# Patient Record
Sex: Female | Born: 1960 | Race: White | Hispanic: No | Smoking: Current every day smoker
Health system: Southern US, Community
[De-identification: ages and names within clinical notes are randomized; demographics above are authoritative.]

## PROBLEM LIST (undated history)

## (undated) DIAGNOSIS — M1990 Unspecified osteoarthritis, unspecified site: Secondary | ICD-10-CM

## (undated) DIAGNOSIS — I10 Essential (primary) hypertension: Secondary | ICD-10-CM

## (undated) DIAGNOSIS — R413 Other amnesia: Secondary | ICD-10-CM

## (undated) DIAGNOSIS — K219 Gastro-esophageal reflux disease without esophagitis: Secondary | ICD-10-CM

## (undated) DIAGNOSIS — M549 Dorsalgia, unspecified: Secondary | ICD-10-CM

## (undated) DIAGNOSIS — R519 Headache, unspecified: Secondary | ICD-10-CM

## (undated) DIAGNOSIS — K5900 Constipation, unspecified: Secondary | ICD-10-CM

## (undated) DIAGNOSIS — Z8489 Family history of other specified conditions: Secondary | ICD-10-CM

## (undated) DIAGNOSIS — M26629 Arthralgia of temporomandibular joint, unspecified side: Secondary | ICD-10-CM

## (undated) DIAGNOSIS — R202 Paresthesia of skin: Secondary | ICD-10-CM

## (undated) DIAGNOSIS — M2550 Pain in unspecified joint: Secondary | ICD-10-CM

## (undated) DIAGNOSIS — R42 Dizziness and giddiness: Secondary | ICD-10-CM

## (undated) DIAGNOSIS — M2540 Effusion, unspecified joint: Secondary | ICD-10-CM

## (undated) DIAGNOSIS — M62838 Other muscle spasm: Secondary | ICD-10-CM

## (undated) DIAGNOSIS — Z9889 Other specified postprocedural states: Secondary | ICD-10-CM

## (undated) DIAGNOSIS — R200 Anesthesia of skin: Secondary | ICD-10-CM

## (undated) DIAGNOSIS — E785 Hyperlipidemia, unspecified: Secondary | ICD-10-CM

## (undated) DIAGNOSIS — Z8739 Personal history of other diseases of the musculoskeletal system and connective tissue: Secondary | ICD-10-CM

## (undated) DIAGNOSIS — R112 Nausea with vomiting, unspecified: Secondary | ICD-10-CM

## (undated) DIAGNOSIS — R51 Headache: Secondary | ICD-10-CM

## (undated) DIAGNOSIS — R350 Frequency of micturition: Secondary | ICD-10-CM

## (undated) DIAGNOSIS — G9050 Complex regional pain syndrome I, unspecified: Secondary | ICD-10-CM

## (undated) DIAGNOSIS — Z8669 Personal history of other diseases of the nervous system and sense organs: Secondary | ICD-10-CM

## (undated) HISTORY — PX: COLONOSCOPY: SHX174

## (undated) HISTORY — DX: Headache, unspecified: R51.9

## (undated) HISTORY — PX: SHOULDER SURGERY: SHX246

## (undated) HISTORY — PX: NOSE SURGERY: SHX723

## (undated) HISTORY — PX: KNEE ARTHROSCOPY: SUR90

## (undated) HISTORY — DX: Headache: R51

## (undated) HISTORY — PX: WRIST SURGERY: SHX841

## (undated) HISTORY — PX: ABDOMINAL HYSTERECTOMY: SHX81

## (undated) HISTORY — PX: SPINAL CORD STIMULATOR IMPLANT: SHX2422

## (undated) HISTORY — PX: TMJ ARTHROPLASTY: SHX1066

## (undated) HISTORY — PX: OTHER SURGICAL HISTORY: SHX169

## (undated) HISTORY — DX: Arthralgia of temporomandibular joint, unspecified side: M26.629

---

## 2000-07-11 ENCOUNTER — Other Ambulatory Visit: Admission: RE | Admit: 2000-07-11 | Discharge: 2000-07-11 | Payer: Self-pay | Admitting: Orthopedic Surgery

## 2003-04-07 ENCOUNTER — Other Ambulatory Visit: Admission: RE | Admit: 2003-04-07 | Discharge: 2003-04-07 | Payer: Self-pay | Admitting: Obstetrics & Gynecology

## 2003-04-10 ENCOUNTER — Ambulatory Visit (HOSPITAL_COMMUNITY): Admission: RE | Admit: 2003-04-10 | Discharge: 2003-04-10 | Payer: Self-pay | Admitting: Obstetrics & Gynecology

## 2003-07-17 ENCOUNTER — Ambulatory Visit (HOSPITAL_COMMUNITY): Admission: RE | Admit: 2003-07-17 | Discharge: 2003-07-17 | Payer: Self-pay | Admitting: Obstetrics & Gynecology

## 2004-02-25 ENCOUNTER — Ambulatory Visit: Payer: Self-pay | Admitting: Internal Medicine

## 2004-02-27 ENCOUNTER — Encounter: Admission: RE | Admit: 2004-02-27 | Discharge: 2004-02-27 | Payer: Self-pay | Admitting: Internal Medicine

## 2004-03-11 ENCOUNTER — Encounter: Admission: RE | Admit: 2004-03-11 | Discharge: 2004-03-11 | Payer: Self-pay | Admitting: Neurosurgery

## 2004-03-19 ENCOUNTER — Ambulatory Visit (HOSPITAL_COMMUNITY): Admission: RE | Admit: 2004-03-19 | Discharge: 2004-03-20 | Payer: Self-pay | Admitting: Neurosurgery

## 2004-07-03 ENCOUNTER — Emergency Department (HOSPITAL_COMMUNITY): Admission: EM | Admit: 2004-07-03 | Discharge: 2004-07-03 | Payer: Self-pay | Admitting: Emergency Medicine

## 2009-12-23 ENCOUNTER — Ambulatory Visit: Payer: Self-pay | Admitting: Pain Medicine

## 2009-12-31 ENCOUNTER — Ambulatory Visit: Payer: Self-pay | Admitting: Pain Medicine

## 2010-01-11 ENCOUNTER — Ambulatory Visit: Payer: Self-pay | Admitting: Pain Medicine

## 2010-01-21 ENCOUNTER — Other Ambulatory Visit: Payer: Self-pay | Admitting: Pain Medicine

## 2010-02-02 ENCOUNTER — Ambulatory Visit: Payer: Self-pay | Admitting: Pain Medicine

## 2010-02-22 ENCOUNTER — Ambulatory Visit: Payer: Self-pay | Admitting: Pain Medicine

## 2010-05-01 ENCOUNTER — Encounter: Payer: Self-pay | Admitting: Neurosurgery

## 2010-05-03 ENCOUNTER — Ambulatory Visit: Payer: Self-pay | Admitting: Pain Medicine

## 2010-05-31 ENCOUNTER — Ambulatory Visit: Payer: Worker's Compensation | Admitting: Physical Medicine & Rehabilitation

## 2010-05-31 ENCOUNTER — Encounter: Payer: Worker's Compensation | Attending: Physical Medicine & Rehabilitation

## 2010-05-31 DIAGNOSIS — G90529 Complex regional pain syndrome I of unspecified lower limb: Secondary | ICD-10-CM

## 2010-06-01 ENCOUNTER — Other Ambulatory Visit (HOSPITAL_COMMUNITY): Payer: Self-pay | Admitting: Pain Medicine

## 2010-06-01 DIAGNOSIS — R52 Pain, unspecified: Secondary | ICD-10-CM

## 2010-06-21 ENCOUNTER — Ambulatory Visit: Payer: Self-pay | Admitting: Pain Medicine

## 2010-07-02 NOTE — Consult Note (Signed)
REASON FOR CONSULTATION:  This is a second opinion consultation to determine if spinal cord stimulator trial would be best option and if there are other treatment options appropriate for the pain management.  HISTORY OF PRESENT ILLNESS:  The patient is a 50 year old female who was working on March 03, 2009 when she lost her balance and fell falling onto a flexed knee.  She initially was evaluated by Dr. Luiz Blare and then Dr. Madelon Lips.  Records reviewed include records from [facility], [facility], [facility] Dr. Candise Bowens office, [facility], Dr. Luiz Blare' office, [facility], [facility].  The patient had MRI of the knee on March 16, 2009 showing severe chondromalacia patella.  She then underwent some physical therapy, underwent evaluation by Dr. Madelon Lips, recommended to have arthroscopy which was performed on June 17, 2009, and left knee lateral release was performed.  She was noted to have redness and increased pain in the left lower extremity at the knee and foot area postoperatively. The patient does not remember exactly when this came on but it was not immediately postoperatively.  She was diagnosed with reflex sympathetic dystrophy.  Dr. Madelon Lips sent to Dr. Yolanda Bonine for lumbar sympathetic injections which were performed per patient chart on 8 occasions, although I only have 3 of the notes.  She indicates she had about a 12- hour relief from her pain.  The 3 injections that I reviewed were done at the L2-L3 level on the left side.  Because of the short-term relief she obtained with these sympathetic injections, she was referred to Dr. Laban Emperor at [facility] to evaluate for radiofrequency lumbar sympathetic neurolysis.  She first underwent lumbar sympathetic blocks at L4-L5 and then later underwent radiofrequency neurotomy at L4- L5  performed on February 04, 2010.  The patient had no postprocedure complications.  Unfortunately, her pain was back in about 12 hours. Procedure was performed at L4-L5 levels and was pulsed radiofrequency.  Other medications used to include Neurontin, she is up to 400 mg t.i.d. She is on clonidine 0.1 daily, ibuprofen 800 t.i.d.  She is for her headaches on Effexor by Dr. Santiago Glad, neurologist.  She has tried codeine which has caused vomiting in the past.  She has also tried narcotic analgesics after surgeries and these made her feel overall nauseated and tired.  Her average pain is 7/10 described as sharp, burning, stabbing, tingling, aching, improving with medications to minor degree and is worsened with walking and standing.  She can walk 20 minutes and at times she climbs steps but not as much as she used to.  She does not drive.  She needs help with household duties and shopping otherwise independent.  REVIEW OF SYSTEMS:  Bladder problems, numbness, tremor, tingling, trouble walking, spasms, and confusion.  PAST MEDICAL HISTORY:  Hypertension.  PAST SURGICAL HISTORY:  Cervical fusion 2006, ACDF C5-C6 level for left C6 radiculopathy.  She also has had a endometriosis, chronic pelvic pain, and lysis of adhesions in the past.  SOCIAL HISTORY:  Single, lives with her friend, [name].  Smokes 1 pack per day.  FAMILY HISTORY:  Heart disease, diabetes, and hypertension.  PHYSICAL EXAMINATION:  VITAL SIGNS:  Blood pressure 120/74, pulse 77, respirations 18, and sat 97% on room air. GENERAL:  No acute distress.  Mood and affect is mildly anxious. NEUROLOGIC:  Her gait is with reduced weightbearing on left lower extremity.  She uses a cane.  Her upper extremity strength is normal. Neck range of motion is 75% in all planes.  Lumbar has no tenderness to palpation.  In her lower extremities, she has deep reddish purple discoloration, worse at the foot but extending up  approximately toward the knee.  She has hypersensitivity to touch over this entire area.  She has skin temperature 75.5 dorsum of the left foot, 78.7 dorsum of the right foot, 78.2 on the patella left, and 79.7 on the patella right.  Her motor strength is 3+ in the left lower extremity, hip flexion, knee extension, ankle dorsiflexion.  This is limited by actual putting pressure on her leg to do manual muscle testing, 5/5 strength in right lower extremity.  She has weak pulse, but once again difficult to palpate secondary to hypersensitivity on the left side compared to right side.  There is no evidence of dyshidrosis.  There are no nail changes.  There is no evidence of joint contracture although range of motion is reduced in the toes and ankles actively.  IMPRESSION:  Complex regional pain syndrome. 1. Left lower extremity from knee down to the foot.  We discussed what     has been done as well as treatment options.  From a physical     therapy standpoint, she may benefit from desensitization exercises. 2. From a medication standpoint, she may benefit from increased dosage     of Neurontin, certainly going up to 600 t.i.d. or q.i.d.  We also     discussed narcotic analgesics that she generally does not tolerate     these very well and likely not have a good risk benefit ratio in     regards to this.  Other medications include topical capsaicin,     although this is often poorly tolerated. 3. We discussed briefly intravenous medicines like bisphosphonates,     however, I did discuss that I did not have any clinical experience     with this and I could not give her any detailed information about     this treatment modality. 4. We spoke about interventional procedures and the fact that she had     sympathetic blocks at L2-L3 as well as L4-L5.  She had her     sympathetic pulse radiofrequency at L4-L5.  We discussed that other     option would be to do multilevel L2-L3 and L4-L5 and  using hot     radiofrequency, i.e. 70-80 degrees Celsius treatment rather than     pulsed.  We discussed spinal cord stimulation trial as a reasonable treatment alternative given that she is greater than 6 months post.  She has continued sympathetically mediated pain.  She has reportedly underwent psychiatric or psychological evaluation and has passed that portion.  This discussion was along with her case manager, Bonney Aid, with the patient's consent.  I did request some additional notes from Dr. Cherlyn Labella other injections and if need be can provide an addendum to this dictation.  I did not plan any followup with the patient given that this is a second opinion consultation.     Erick Colace, M.D.    AEK/MedQ D:06/01/2010 10:33:40  T:06/01/2010 20:19:53  Job #:  [id number]  cc:   Bonney Aid Fax #:  [phone]  Electronically Signed by Claudette Laws M.D. on 06/12/2010 01:02:17 PM REASON FOR CONSULTATION:  This is a second opinion consultation to determine if spinal cord stimulator trial would be best option and if there are other treatment options appropriate  for the pain management.  HISTORY OF PRESENT ILLNESS:  The patient is a 50 year old female who was working on March 03, 2009 when she lost her balance and fell falling onto a flexed knee.  She initially was evaluated by Dr. Luiz Blare and then Dr. Madelon Lips.  Records reviewed include records from [facility], [facility], [facility] Dr. Candise Bowens office, [facility], Dr. Luiz Blare' office, [facility], [facility].  The patient had MRI of the knee on March 16, 2009 showing severe chondromalacia patella.  She then underwent some physical therapy, underwent evaluation by Dr. Madelon Lips, recommended to have arthroscopy which was performed on June 17, 2009, and left knee lateral release was  performed.  She was noted to have redness and increased pain in the left lower extremity at the knee and foot area postoperatively. The patient does not remember exactly when this came on but it was not immediately postoperatively.  She was diagnosed with reflex sympathetic dystrophy.  Dr. Madelon Lips sent to Dr. Yolanda Bonine for lumbar sympathetic injections which were performed per patient chart on 8 occasions, although I only have 3 of the notes.  She indicates she had about a 12- hour relief from her pain.  The 3 injections that I reviewed were done at the L2-L3 level on the left side.  Because of the short-term relief she obtained with these sympathetic injections, she was referred to Dr. Laban Emperor at [facility] to evaluate for radiofrequency lumbar sympathetic neurolysis.  She first underwent lumbar sympathetic blocks at L4-L5 and then later underwent radiofrequency neurotomy at L4- L5 performed on February 04, 2010.  The patient had no postprocedure complications.  Unfortunately, her pain was back in about 12 hours. Procedure was performed at L4-L5 levels and was pulsed radiofrequency.  Other medications used to include Neurontin, she is up to 400 mg t.i.d. She is on clonidine 0.1 daily, ibuprofen 800 t.i.d.  She is for her headaches on Effexor by Dr. Santiago Glad, neurologist.  She has tried codeine which has caused vomiting in the past.  She has also tried narcotic analgesics after surgeries and these made her feel overall nauseated and tired.  Her average pain is 7/10 described as sharp, burning, stabbing, tingling, aching, improving with medications to minor degree and is worsened with walking and standing.  She can walk 20 minutes and at times she climbs steps but not as much as she used to.  She does not drive.  She needs help with household duties and shopping otherwise independent.  REVIEW OF SYSTEMS:  Bladder problems, numbness, tremor, tingling, trouble  walking, spasms, and confusion.  PAST MEDICAL HISTORY:  Hypertension.  PAST SURGICAL HISTORY:  Cervical fusion 2006, ACDF C5-C6 level for left C6 radiculopathy.  She also has had a endometriosis, chronic pelvic pain, and lysis of adhesions in the past.  SOCIAL HISTORY:  Single, lives with her friend, [name].  Smokes 1 pack per day.  FAMILY HISTORY:  Heart disease, diabetes, and hypertension.  PHYSICAL EXAMINATION:  VITAL SIGNS:  Blood pressure 120/74, pulse 77, respirations 18, and sat 97% on room air. GENERAL:  No acute distress.  Mood and affect is mildly anxious. NEUROLOGIC:  Her gait is with reduced weightbearing on left lower extremity.  She uses a cane.  Her upper extremity strength is normal. Neck range of motion is 75% in all planes.  Lumbar has no tenderness to palpation.  In her lower extremities, she  has deep reddish purple discoloration, worse at the foot but extending up approximately toward the knee.  She has hypersensitivity to touch over this entire area.  She has skin temperature 75.5 dorsum of the left foot, 78.7 dorsum of the right foot, 78.2 on the patella left, and 79.7 on the patella right.  Her motor strength is 3+ in the left lower extremity, hip flexion, knee extension, ankle dorsiflexion.  This is limited by actual putting pressure on her leg to do manual muscle testing, 5/5 strength in right lower extremity.  She has weak pulse, but once again difficult to palpate secondary to hypersensitivity on the left side compared to right side.  There is no evidence of dyshidrosis.  There are no nail changes.  There is no evidence of joint contracture although range of motion is reduced in the toes and ankles actively.  IMPRESSION:  Complex regional pain syndrome. 1. Left lower extremity from knee down to the foot.  We discussed what     has been done as well as treatment options.  From a physical     therapy standpoint, she may benefit from desensitization  exercises. 2. From a medication standpoint, she may benefit from increased dosage     of Neurontin, certainly going up to 600 t.i.d. or q.i.d.  We also     discussed narcotic analgesics that she generally does not tolerate     these very well and likely not have a good risk benefit ratio in     regards to this.  Other medications include topical capsaicin.075%     QID to foot, although this is often poorly tolerated. 3. We discussed briefly intravenous medicines like bisphosphonates,     however, I did discuss that I did not have any clinical experience     with this and I could not give her any detailed information about     this treatment modality. 4. We spoke about interventional procedures and the fact that she had     sympathetic blocks at L2-L3 as well as L4-L5.  She had her     sympathetic pulse radiofrequency at L4-L5.  We discussed that other     option would be to do multilevel L2-L3 and L4-L5 and using hot     radiofrequency, i.e. 70-80 degrees Celsius treatment rather than     pulsed.  We discussed spinal cord stimulation trial as a reasonable treatment alternative given that she is greater than 6 months post.  She has continued sympathetically mediated pain.  She has reportedly underwent psychiatric or psychological evaluation and has passed that portion.  This discussion was along with her case manager, Bonney Aid, with the patient's consent.  I did request some additional notes from Dr. Cherlyn Labella other injections and if need be can provide an addendum to this dictation.  I did not plan any followup with the patient given that this is a second opinion consultation.   Erick Colace, M.D. Electronically Signed    AEK/MedQ D:06/01/2010 10:33:40  T:06/01/2010 20:19:53  Job #:  [id number]  cc:   Bonney Aid Fax #:  [phone]

## 2010-07-05 ENCOUNTER — Ambulatory Visit (HOSPITAL_COMMUNITY)
Admission: RE | Admit: 2010-07-05 | Discharge: 2010-07-05 | Disposition: A | Discharge status: Home / Self Care | Payer: Worker's Compensation | Source: Ambulatory Visit | Attending: Pain Medicine | Admitting: Pain Medicine

## 2010-07-05 ENCOUNTER — Encounter (HOSPITAL_COMMUNITY)
Admission: RE | Admit: 2010-07-05 | Discharge: 2010-07-05 | Disposition: A | Discharge status: Home / Self Care | Payer: Worker's Compensation | Source: Ambulatory Visit | Attending: Pain Medicine | Admitting: Pain Medicine

## 2010-07-05 DIAGNOSIS — R52 Pain, unspecified: Secondary | ICD-10-CM

## 2010-07-05 DIAGNOSIS — M79609 Pain in unspecified limb: Secondary | ICD-10-CM | POA: Insufficient documentation

## 2010-07-05 MED ORDER — TECHNETIUM TC 99M MEDRONATE IV KIT
25.0000 | PACK | Freq: Once | INTRAVENOUS | Status: AC | PRN
  Administered 2010-07-05: 25 via INTRAVENOUS

## 2010-09-18 ENCOUNTER — Emergency Department (HOSPITAL_COMMUNITY)
Admission: EM | Admit: 2010-09-18 | Discharge: 2010-09-18 | Disposition: A | Discharge status: Home / Self Care | Payer: Worker's Compensation | Attending: Emergency Medicine | Admitting: Emergency Medicine

## 2010-09-18 DIAGNOSIS — N390 Urinary tract infection, site not specified: Secondary | ICD-10-CM | POA: Insufficient documentation

## 2010-09-18 DIAGNOSIS — R21 Rash and other nonspecific skin eruption: Secondary | ICD-10-CM | POA: Insufficient documentation

## 2010-09-18 LAB — URINALYSIS, ROUTINE W REFLEX MICROSCOPIC
Glucose, UA: NEGATIVE mg/dL
Ketones, ur: 40 mg/dL — AB
Nitrite: NEGATIVE
Protein, ur: 30 mg/dL — AB
Specific Gravity, Urine: 1.026 (ref 1.005–1.030)
Urobilinogen, UA: 1 mg/dL (ref 0.0–1.0)
pH: 6 (ref 5.0–8.0)

## 2010-09-18 LAB — CBC
HCT: 46.3 % — ABNORMAL HIGH (ref 36.0–46.0)
Hemoglobin: 15.9 g/dL — ABNORMAL HIGH (ref 12.0–15.0)
MCH: 30.7 pg (ref 26.0–34.0)
MCHC: 34.3 g/dL (ref 30.0–36.0)
MCV: 89.4 fL (ref 78.0–100.0)
Platelets: 199 10*3/uL (ref 150–400)
RBC: 5.18 MIL/uL — ABNORMAL HIGH (ref 3.87–5.11)
RDW: 13.7 % (ref 11.5–15.5)
WBC: 8 10*3/uL (ref 4.0–10.5)

## 2010-09-18 LAB — BASIC METABOLIC PANEL
BUN: 11 mg/dL (ref 6–23)
CO2: 26 mEq/L (ref 19–32)
Calcium: 9.3 mg/dL (ref 8.4–10.5)
Chloride: 99 mEq/L (ref 96–112)
Creatinine, Ser: 0.59 mg/dL (ref 0.4–1.2)
GFR calc Af Amer: >60 mL/min (ref >60)
GFR calc non Af Amer: >60 mL/min (ref >60)
Glucose, Bld: 139 mg/dL — ABNORMAL HIGH (ref 70–99)
Potassium: 4.3 mEq/L (ref 3.5–5.1)
Sodium: 134 mEq/L — ABNORMAL LOW (ref 135–145)

## 2010-09-18 LAB — DIFFERENTIAL
Basophils Absolute: 0 10*3/uL (ref 0.0–0.1)
Basophils Relative: 0 % (ref 0–1)
Eosinophils Absolute: 0.1 10*3/uL (ref 0.0–0.7)
Eosinophils Relative: 1 % (ref 0–5)
Lymphocytes Relative: 4 % — ABNORMAL LOW (ref 12–46)
Lymphs Abs: 0.4 10*3/uL — ABNORMAL LOW (ref 0.7–4.0)
Monocytes Absolute: 0.3 10*3/uL (ref 0.1–1.0)
Monocytes Relative: 4 % (ref 3–12)
Neutro Abs: 7.2 10*3/uL (ref 1.7–7.7)
Neutrophils Relative %: 90 % — ABNORMAL HIGH (ref 43–77)

## 2010-09-18 LAB — URINE MICROSCOPIC-ADD ON

## 2010-09-19 LAB — URINE CULTURE
Colony Count: 65000
Culture  Setup Time: 201206092130

## 2011-02-15 ENCOUNTER — Emergency Department (HOSPITAL_COMMUNITY): Payer: Worker's Compensation

## 2011-02-15 ENCOUNTER — Encounter: Payer: Self-pay | Admitting: *Deleted

## 2011-02-15 ENCOUNTER — Emergency Department (HOSPITAL_COMMUNITY)
Admission: EM | Admit: 2011-02-15 | Discharge: 2011-02-15 | Disposition: A | Discharge status: Home / Self Care | Payer: Worker's Compensation | Attending: Emergency Medicine | Admitting: Emergency Medicine

## 2011-02-15 DIAGNOSIS — G9050 Complex regional pain syndrome I, unspecified: Secondary | ICD-10-CM | POA: Insufficient documentation

## 2011-02-15 DIAGNOSIS — Z9889 Other specified postprocedural states: Secondary | ICD-10-CM | POA: Insufficient documentation

## 2011-02-15 DIAGNOSIS — R232 Flushing: Secondary | ICD-10-CM | POA: Insufficient documentation

## 2011-02-15 DIAGNOSIS — Z79899 Other long term (current) drug therapy: Secondary | ICD-10-CM | POA: Insufficient documentation

## 2011-02-15 DIAGNOSIS — R51 Headache: Secondary | ICD-10-CM

## 2011-02-15 DIAGNOSIS — H1130 Conjunctival hemorrhage, unspecified eye: Secondary | ICD-10-CM | POA: Insufficient documentation

## 2011-02-15 DIAGNOSIS — I10 Essential (primary) hypertension: Secondary | ICD-10-CM | POA: Insufficient documentation

## 2011-02-15 DIAGNOSIS — M79609 Pain in unspecified limb: Secondary | ICD-10-CM | POA: Insufficient documentation

## 2011-02-15 HISTORY — DX: Essential (primary) hypertension: I10

## 2011-02-15 HISTORY — DX: Complex regional pain syndrome I, unspecified: G90.50

## 2011-02-15 LAB — BASIC METABOLIC PANEL
BUN: 12 mg/dL (ref 6–23)
CO2: 23 mEq/L (ref 19–32)
Calcium: 9.4 mg/dL (ref 8.4–10.5)
Chloride: 103 mEq/L (ref 96–112)
Creatinine, Ser: 0.71 mg/dL (ref 0.50–1.10)
GFR calc Af Amer: >90 mL/min (ref >90)
GFR calc non Af Amer: >90 mL/min (ref >90)
Glucose, Bld: 97 mg/dL (ref 70–99)
Potassium: 3.6 mEq/L (ref 3.5–5.1)
Sodium: 136 mEq/L (ref 135–145)

## 2011-02-15 LAB — CBC
HCT: 41.8 % (ref 36.0–46.0)
Hemoglobin: 13.8 g/dL (ref 12.0–15.0)
MCH: 29.2 pg (ref 26.0–34.0)
MCHC: 33 g/dL (ref 30.0–36.0)
MCV: 88.4 fL (ref 78.0–100.0)
Platelets: 189 10*3/uL (ref 150–400)
RBC: 4.73 MIL/uL (ref 3.87–5.11)
RDW: 12.5 % (ref 11.5–15.5)
WBC: 6 10*3/uL (ref 4.0–10.5)

## 2011-02-15 LAB — URINALYSIS, ROUTINE W REFLEX MICROSCOPIC
Bilirubin Urine: NEGATIVE
Glucose, UA: NEGATIVE mg/dL
Hgb urine dipstick: NEGATIVE
Ketones, ur: NEGATIVE mg/dL
Leukocytes, UA: NEGATIVE
Nitrite: NEGATIVE
Protein, ur: NEGATIVE mg/dL
Specific Gravity, Urine: 1.021 (ref 1.005–1.030)
Urobilinogen, UA: 0.2 mg/dL (ref 0.0–1.0)
pH: 6 (ref 5.0–8.0)

## 2011-02-15 MED ORDER — CLONIDINE HCL 0.2 MG PO TABS: 0.1000 mg | ORAL_TABLET | Freq: Two times a day (BID) | ORAL | Status: DC

## 2011-02-15 MED ORDER — METOCLOPRAMIDE HCL 5 MG/ML IJ SOLN
10.0000 mg | Freq: Once | INTRAMUSCULAR | Status: AC
  Administered 2011-02-15: 10 mg via INTRAVENOUS
  Filled 2011-02-15: qty 2

## 2011-02-15 MED ORDER — DIPHENHYDRAMINE HCL 50 MG/ML IJ SOLN
25.0000 mg | Freq: Once | INTRAMUSCULAR | Status: AC
  Administered 2011-02-15: 25 mg via INTRAVENOUS
  Filled 2011-02-15: qty 1

## 2011-02-15 MED ORDER — OXYCODONE-ACETAMINOPHEN 5-325 MG PO TABS
1.0000 | ORAL_TABLET | Freq: Once | ORAL | Status: AC
  Administered 2011-02-15: 1 via ORAL
  Filled 2011-02-15: qty 1

## 2011-02-15 NOTE — ED Notes (Signed)
Secondary Assessment: pt reports headache last night, woke up with  bloody drainage on right eye, right eye remains red, pupil react to light equally, blurry vision on right eye, R eye pain 8/10, sore and sharp.  Also reports HTN. BP 156/89 in room. Last bp meds was 2 weeks ago, out of HTN med. HR 75 in room

## 2011-02-15 NOTE — ED Notes (Signed)
Pt. Has relief from headache, is sleepy from earlier meds.

## 2011-02-15 NOTE — ED Notes (Signed)
Pt states that she woke up this morning with blood in her eye after some irritation to her eye yesterday. Patient states that she has a severe headache today as well as a lightheaded feeling and feels unable to walk due to this feeling. Pt states that her blood pressure has been running high for the past 6 months. MD has taken her off of a medication recently, and NSAID, thinking that that was what was making her BP high.Pt head has been hurting for the past 2 weeks and her face has been flushed for the same amount of time.

## 2011-02-15 NOTE — ED Notes (Signed)
Patient sleeping in her bed, respirations even and non-labored

## 2011-02-15 NOTE — ED Provider Notes (Signed)
History     CSN: [id number] Arrival date & time: 02/15/2011  3:25 PM   First MD Initiated Contact with Patient 02/15/11 1818      Chief Complaint  Patient presents with  . Hypertension    (Consider location/radiation/quality/duration/timing/severity/associated sxs/prior treatment) HPI History presents with a complaint of headache and concern that her blood pressure has been running high for several months. She states the headache began yesterday and has been getting worse throughout the day. She has had a headache intermittently over the past 2 weeks as well. Patient states she saw her physician one week ago and he prescribed clonidine but she has not received mail order prescriptions and has not started this medication. She denies any changes in her vision any changes in her speech, weakness or numbness. She has had no vomiting or fevers. She does state that she feels that her face is flushed. She also notes that she saw some blood in her right eye that she noted earlier today. She does not have any changes in her vision and had no trauma to her eye. No drainage from her eye either.  Past Medical History  Diagnosis Date  . Hypertension   . RSD (reflex sympathetic dystrophy)     Past Surgical History  Procedure Date  . Tmj arthroplasty   . Abdominal hysterectomy   . Ovary removed   . Neck fusion     History reviewed. No pertinent family history.  History  Substance Use Topics  . Smoking status: Former Smoker    Quit date: 09/15/2010  . Smokeless tobacco: Not on file  . Alcohol Use:     OB History    Grav Para Term Preterm Abortions TAB SAB Ect Mult Living                  Review of Systems ROS reviewed and otherwise negative except for mentioned in HPI  Allergies  Codeine and Sulfa antibiotics  Home Medications   Current Outpatient Rx  Name Route Sig Dispense Refill  . LEVETIRACETAM 250 MG PO TABS Oral Take 250 mg by mouth every 12 (twelve) hours.      .  OMEPRAZOLE 40 MG PO CPDR Oral Take 40 mg by mouth daily.      . TOPIRAMATE 25 MG PO TABS Oral Take 25 mg by mouth 2 (two) times daily. Pt takes 1 tablet in the morning and 2 tablet at night     . VENLAFAXINE HCL 75 MG PO CP24 Oral Take 75 mg by mouth daily.        BP 136/87  Pulse 73  Temp(Src) 98 F (36.7 C) (Oral)  Resp 18  Ht 5\' 6"  (1.676 m)  Wt 165 lb (74.844 kg)  BMI 26.63 kg/m2  SpO2 99% Vitals reviewed Physical Exam Physical Examination: General appearance - alert, well appearing, and in no distress Mental status - alert, oriented to person, place, and time Eyes - pupils equal and reactive, extraocular eye movements intact, subconjunctival hemorrhage on right, no hyphema Mouth - mucous membranes moist, pharynx normal without lesions Chest - clear to auscultation, no wheezes, rales or rhonchi, symmetric air entry Heart - normal rate, regular rhythm, normal S1, S2, no murmurs, rubs, clicks or gallops Abdomen - soft, nontender, nondistended, no masses or organomegaly Neurological - alert, oriented, normal speech, no focal findings or movement disorder noted, CN 2-12 tested and intact, strength 5/5 in extremities x 4, sensation intact Musculoskeletal - no joint tenderness, deformity or swelling Extremities -  peripheral pulses normal, no pedal edema, no clubbing or cyanosis, pt does use crutches due to bilateral leg pain- this is chronic/RSD related Skin - normal coloration and turgor, no rashes, no suspicious skin lesions noted  ED Course  Procedures (including critical care time)  Labs Reviewed  URINALYSIS, ROUTINE W REFLEX MICROSCOPIC - Abnormal; Notable for the following:    Appearance HAZY (*)    All other components within normal limits  CBC  BASIC METABOLIC PANEL   Ct Head Wo Contrast  02/15/2011  *RADIOLOGY REPORT*  Clinical Data: Hypertension.  Headache.  Bloody drainage from I. Right thigh pain.  CT HEAD WITHOUT CONTRAST  Technique:  Contiguous axial images were  obtained from the base of the skull through the vertex without contrast.  Comparison: None.  Findings: No mass effect, midline shift, or acute intracranial hemorrhage.  Ventricles system and extraaxial space are unremarkable.  Mastoid air cells are clear.  Visualized paranasal sinuses are clear.  Cranium is intact.  IMPRESSION: No acute intracranial pathology.  Original Report Authenticated By: Donavan Burnet, M.D.     No diagnosis found.  10:57 PM Pt reassessed and feels much improved.  She has no further headache.  Blood pressure has been rechecked multiple times and is not significantly elevated.  She states that her prescription for clonidine should be arriving tomorrow.  Will give rx in case she does not receive it.     MDM  Patient presenting with complaint of headache and concern for hypertension. Her head CT today was normal. Her headache is completely resolved after medications. Her blood pressure is been checked numerous times and is mildly elevated but not significantly so. There are no signs of hypertensive urgency or emergency in this patient. She was discharged with strict return precautions and is agreeable with this plan.        Ethelda Chick, MD 02/15/11 [phone]

## 2011-02-15 NOTE — ED Notes (Signed)
ZOX:WRUE<AV> Expected date:02/15/11<BR> Expected time: 5:55 PM<BR> Means of arrival:Ambulance<BR> Comments:<BR> M11 - 76yoM Dehydrated, hypotensive ETA 15

## 2011-02-15 NOTE — ED Notes (Signed)
Visual acuity screening  Right eye 20/40 Left eye 20/20 Both eyes together 20/20

## 2012-04-09 ENCOUNTER — Encounter: Payer: Self-pay | Admitting: Internal Medicine

## 2012-05-01 ENCOUNTER — Encounter: Payer: Self-pay | Admitting: Internal Medicine

## 2012-05-01 ENCOUNTER — Ambulatory Visit (AMBULATORY_SURGERY_CENTER): Payer: BC Managed Care – PPO

## 2012-05-01 VITALS — Ht 66.0 in | Wt 168.2 lb

## 2012-05-01 DIAGNOSIS — Z83719 Family history of colon polyps, unspecified: Secondary | ICD-10-CM

## 2012-05-01 DIAGNOSIS — Z1211 Encounter for screening for malignant neoplasm of colon: Secondary | ICD-10-CM

## 2012-05-01 DIAGNOSIS — Z8371 Family history of colonic polyps: Secondary | ICD-10-CM

## 2012-05-01 MED ORDER — MOVIPREP 100 G PO SOLR: 1.0000 | Freq: Once | ORAL | Status: DC

## 2012-05-15 ENCOUNTER — Encounter: Payer: Self-pay | Admitting: Internal Medicine

## 2012-05-15 ENCOUNTER — Ambulatory Visit (AMBULATORY_SURGERY_CENTER): Payer: BC Managed Care – PPO | Admitting: Internal Medicine

## 2012-05-15 VITALS — BP 124/72 | HR 76 | Temp 98.2°F | Resp 16 | Ht 66.0 in | Wt 168.0 lb

## 2012-05-15 DIAGNOSIS — Z1211 Encounter for screening for malignant neoplasm of colon: Secondary | ICD-10-CM

## 2012-05-15 MED ORDER — SODIUM CHLORIDE 0.9 % IV SOLN: 500.0000 mL | INTRAVENOUS | Status: DC

## 2012-05-15 NOTE — Op Note (Signed)
[facility] [address] [place], [address]   COLONOSCOPY PROCEDURE REPORT  PATIENT: Courtney David, Courtney David  MR#: [id number] BIRTHDATE: 04/18/1960 , 51  yrs. old GENDER: Female ENDOSCOPIST: Hart Carwin, MD REFERRED BY:  Jarome Matin, M.D. PROCEDURE DATE:  05/15/2012 PROCEDURE:   Colonoscopy, screening ASA CLASS:   Class I INDICATIONS:Average risk patient for colon cancer. MEDICATIONS: MAC sedation, administered by CRNA and propofol (Diprivan) 200mg  IV  DESCRIPTION OF PROCEDURE:   After the risks and benefits and of the procedure were explained, informed consent was obtained.  A digital rectal exam revealed no abnormalities of the rectum.    The LB [id number]  endoscope was introduced through the anus and advanced to the cecum, which was identified by both the appendix and ileocecal valve .  The quality of the prep was excellent, using MoviPrep .  The instrument was then slowly withdrawn as the colon was fully examined.     COLON FINDINGS: A normal appearing cecum, ileocecal valve, and appendiceal orifice were identified.  The ascending, hepatic flexure, transverse, splenic flexure, descending, sigmoid colon and rectum appeared unremarkable.  No polyps or cancers were seen. Retroflexed views revealed no abnormalities.     The scope was then withdrawn from the patient and the procedure completed.  COMPLICATIONS: There were no complications. ENDOSCOPIC IMPRESSION: Normal colon  RECOMMENDATIONS: High fiber diet   REPEAT EXAM: In 10 year(s)  for Colonoscopy.  cc:  _______________________________ eSignedHart Carwin, MD 05/15/2012 12:33 PM

## 2012-05-15 NOTE — Progress Notes (Signed)
Patient with diagnosis of RSD left leg. Requests that she have the propofol prior to positioning on left side for procedure. Informed Dr. Juanda Chance, room tech. And CRNA assigned to patient's procedure room.

## 2012-05-15 NOTE — Patient Instructions (Addendum)
YOU HAD AN ENDOSCOPIC PROCEDURE TODAY AT THE [facility]: Refer to the procedure report that was given to you for any specific questions about what was found during the examination.  If the procedure report does not answer your questions, please call your gastroenterologist to clarify.  If you requested that your care partner not be given the details of your procedure findings, then the procedure report has been included in a sealed envelope for you to review at your convenience later.  YOU SHOULD EXPECT: Some feelings of bloating in the abdomen. Passage of more gas than usual.  Walking can help get rid of the air that was put into your GI tract during the procedure and reduce the bloating. If you had a lower endoscopy (such as a colonoscopy or flexible sigmoidoscopy) you may notice spotting of blood in your stool or on the toilet paper. If you underwent a bowel prep for your procedure, then you may not have a normal bowel movement for a few days.  DIET: Your first meal following the procedure should be a light meal and then it is ok to progress to your normal diet.  A half-sandwich or bowl of soup is an example of a good first meal.  Heavy or fried foods are harder to digest and may make you feel nauseous or bloated.  Likewise meals heavy in dairy and vegetables can cause extra gas to form and this can also increase the bloating.  Drink plenty of fluids but you should avoid alcoholic beverages for 24 hours.  ACTIVITY: Your care partner should take you home directly after the procedure.  You should plan to take it easy, moving slowly for the rest of the day.  You can resume normal activity the day after the procedure however you should NOT DRIVE or use heavy machinery for 24 hours (because of the sedation medicines used during the test).    SYMPTOMS TO REPORT IMMEDIATELY: A gastroenterologist can be reached at any hour.  During normal business hours, 8:30 AM to 5:00 PM Monday through Friday,  call [phone].  After hours and on weekends, please call the GI answering service at [phone] who will take a message and have the physician on call contact you.   Following lower endoscopy (colonoscopy or flexible sigmoidoscopy):  Excessive amounts of blood in the stool  Significant tenderness or worsening of abdominal pains  Swelling of the abdomen that is new, acute  Fever of 100F or higher  FOLLOW UP: If any biopsies were taken you will be contacted by phone or by letter within the next 1-3 weeks.  Call your gastroenterologist if you have not heard about the biopsies in 3 weeks.  Our staff will call the home number listed on your records the next business day following your procedure to check on you and address any questions or concerns that you may have at that time regarding the information given to you following your procedure. This is a courtesy call and so if there is no answer at the home number and we have not heard from you through the emergency physician on call, we will assume that you have returned to your regular daily activities without incident.  SIGNATURES/CONFIDENTIALITY: You and/or your care partner have signed paperwork which will be entered into your electronic medical record.  These signatures attest to the fact that that the information above on your After Visit Summary has been reviewed and is understood.  Full responsibility of the confidentiality of this   discharge information lies with you and/or your care-partner.   Thank-you for choosing us for your healthcare needs. 

## 2012-05-15 NOTE — Progress Notes (Signed)
Patient did not have preoperative order for IV antibiotic SSI prophylaxis. ([id number])   

## 2012-05-16 ENCOUNTER — Telehealth: Payer: Self-pay | Admitting: *Deleted

## 2012-05-16 NOTE — Telephone Encounter (Signed)
  Follow up Call-  Call back number 05/15/2012  Post procedure Call Back phone  # [phone]  Permission to leave phone message Yes     No answer, left massage.

## 2012-12-07 ENCOUNTER — Inpatient Hospital Stay (HOSPITAL_COMMUNITY): Payer: BC Managed Care – PPO

## 2012-12-07 ENCOUNTER — Encounter (HOSPITAL_COMMUNITY): Payer: Self-pay | Admitting: *Deleted

## 2012-12-07 ENCOUNTER — Inpatient Hospital Stay (HOSPITAL_COMMUNITY)
Admission: EM | Admit: 2012-12-07 | Discharge: 2012-12-09 | DRG: 243 | Disposition: A | Discharge status: Home / Self Care | Payer: BC Managed Care – PPO | Attending: Internal Medicine | Admitting: Internal Medicine

## 2012-12-07 ENCOUNTER — Emergency Department (HOSPITAL_COMMUNITY): Payer: BC Managed Care – PPO

## 2012-12-07 DIAGNOSIS — Z981 Arthrodesis status: Secondary | ICD-10-CM

## 2012-12-07 DIAGNOSIS — M5380 Other specified dorsopathies, site unspecified: Principal | ICD-10-CM | POA: Diagnosis present

## 2012-12-07 DIAGNOSIS — E785 Hyperlipidemia, unspecified: Secondary | ICD-10-CM | POA: Diagnosis present

## 2012-12-07 DIAGNOSIS — R209 Unspecified disturbances of skin sensation: Secondary | ICD-10-CM

## 2012-12-07 DIAGNOSIS — F17200 Nicotine dependence, unspecified, uncomplicated: Secondary | ICD-10-CM | POA: Diagnosis present

## 2012-12-07 DIAGNOSIS — Z882 Allergy status to sulfonamides status: Secondary | ICD-10-CM

## 2012-12-07 DIAGNOSIS — M542 Cervicalgia: Secondary | ICD-10-CM

## 2012-12-07 DIAGNOSIS — G9050 Complex regional pain syndrome I, unspecified: Secondary | ICD-10-CM | POA: Diagnosis present

## 2012-12-07 DIAGNOSIS — I10 Essential (primary) hypertension: Secondary | ICD-10-CM | POA: Diagnosis present

## 2012-12-07 DIAGNOSIS — Z885 Allergy status to narcotic agent status: Secondary | ICD-10-CM

## 2012-12-07 DIAGNOSIS — R29898 Other symptoms and signs involving the musculoskeletal system: Secondary | ICD-10-CM | POA: Diagnosis present

## 2012-12-07 DIAGNOSIS — R5381 Other malaise: Secondary | ICD-10-CM

## 2012-12-07 LAB — BASIC METABOLIC PANEL
CO2: 25 mEq/L (ref 19–32)
Chloride: 105 mEq/L (ref 96–112)
Potassium: 3.7 mEq/L (ref 3.5–5.1)
Sodium: 139 mEq/L (ref 135–145)

## 2012-12-07 LAB — CBC WITH DIFFERENTIAL/PLATELET
Basophils Absolute: 0 10*3/uL (ref 0.0–0.1)
Basophils Relative: 0 % (ref 0–1)
Eosinophils Relative: 2 % (ref 0–5)
HCT: 42.6 % (ref 36.0–46.0)
MCHC: 34.5 g/dL (ref 30.0–36.0)
MCV: 90.1 fL (ref 78.0–100.0)
Monocytes Absolute: 0.5 10*3/uL (ref 0.1–1.0)
Neutro Abs: 5.6 10*3/uL (ref 1.7–7.7)
Platelets: 190 10*3/uL (ref 150–400)
RDW: 13.3 % (ref 11.5–15.5)

## 2012-12-07 MED ORDER — SODIUM CHLORIDE 0.9 % IV SOLN
INTRAVENOUS | Status: AC
  Administered 2012-12-07 – 2012-12-08 (×2): via INTRAVENOUS

## 2012-12-07 MED ORDER — HYDROMORPHONE HCL PF 1 MG/ML IJ SOLN
1.0000 mg | INTRAMUSCULAR | Status: DC | PRN
  Administered 2012-12-07 – 2012-12-09 (×9): 1 mg via INTRAVENOUS
  Filled 2012-12-07 (×9): qty 1

## 2012-12-07 MED ORDER — CYCLOBENZAPRINE HCL 10 MG PO TABS
5.0000 mg | ORAL_TABLET | Freq: Three times a day (TID) | ORAL | Status: DC | PRN
  Administered 2012-12-08 – 2012-12-09 (×2): 5 mg via ORAL
  Filled 2012-12-07 (×2): qty 1

## 2012-12-07 MED ORDER — ONDANSETRON HCL 4 MG PO TABS: 4.0000 mg | ORAL_TABLET | Freq: Four times a day (QID) | ORAL | Status: DC | PRN

## 2012-12-07 MED ORDER — FENTANYL CITRATE 0.05 MG/ML IJ SOLN
100.0000 ug | Freq: Once | INTRAMUSCULAR | Status: AC
  Administered 2012-12-07: 100 ug via INTRAVENOUS
  Filled 2012-12-07: qty 2

## 2012-12-07 MED ORDER — ACETAMINOPHEN 650 MG RE SUPP: 650.0000 mg | Freq: Four times a day (QID) | RECTAL | Status: DC | PRN

## 2012-12-07 MED ORDER — TAPENTADOL HCL ER 50 MG PO TB12
50.0000 mg | ORAL_TABLET | Freq: Four times a day (QID) | ORAL | Status: DC | PRN
  Filled 2012-12-07 (×3): qty 1

## 2012-12-07 MED ORDER — ACETAMINOPHEN 325 MG PO TABS: 650.0000 mg | ORAL_TABLET | Freq: Four times a day (QID) | ORAL | Status: DC | PRN

## 2012-12-07 MED ORDER — VITAMIN D (ERGOCALCIFEROL) 1.25 MG (50000 UNIT) PO CAPS: 50000.0000 [IU] | ORAL_CAPSULE | ORAL | Status: DC

## 2012-12-07 MED ORDER — METHYLPREDNISOLONE SODIUM SUCC 40 MG IJ SOLR
40.0000 mg | Freq: Once | INTRAMUSCULAR | Status: AC
  Administered 2012-12-07: 40 mg via INTRAVENOUS
  Filled 2012-12-07: qty 1

## 2012-12-07 MED ORDER — ATORVASTATIN CALCIUM 40 MG PO TABS
40.0000 mg | ORAL_TABLET | Freq: Every day | ORAL | Status: DC
  Administered 2012-12-08 – 2012-12-09 (×2): 40 mg via ORAL
  Filled 2012-12-07 (×2): qty 1

## 2012-12-07 MED ORDER — ONDANSETRON HCL 4 MG/2ML IJ SOLN
4.0000 mg | Freq: Three times a day (TID) | INTRAMUSCULAR | Status: AC | PRN
  Administered 2012-12-07: 4 mg via INTRAVENOUS
  Filled 2012-12-07: qty 2

## 2012-12-07 MED ORDER — TOPIRAMATE 25 MG PO TABS
50.0000 mg | ORAL_TABLET | Freq: Two times a day (BID) | ORAL | Status: DC
  Administered 2012-12-08 – 2012-12-09 (×4): 50 mg via ORAL
  Filled 2012-12-07 (×5): qty 2

## 2012-12-07 MED ORDER — PANTOPRAZOLE SODIUM 40 MG PO TBEC
40.0000 mg | DELAYED_RELEASE_TABLET | Freq: Every day | ORAL | Status: DC
  Administered 2012-12-08 – 2012-12-09 (×2): 40 mg via ORAL
  Filled 2012-12-07 (×2): qty 1

## 2012-12-07 MED ORDER — VENLAFAXINE HCL ER 75 MG PO CP24
75.0000 mg | ORAL_CAPSULE | Freq: Every day | ORAL | Status: DC
  Administered 2012-12-08 – 2012-12-09 (×2): 75 mg via ORAL
  Filled 2012-12-07 (×2): qty 1

## 2012-12-07 MED ORDER — ONDANSETRON HCL 4 MG/2ML IJ SOLN
4.0000 mg | Freq: Once | INTRAMUSCULAR | Status: AC
  Administered 2012-12-07: 4 mg via INTRAVENOUS
  Filled 2012-12-07: qty 2

## 2012-12-07 MED ORDER — CLONIDINE HCL 0.1 MG PO TABS
0.1000 mg | ORAL_TABLET | Freq: Two times a day (BID) | ORAL | Status: DC
  Administered 2012-12-08 (×3): 0.05 mg via ORAL
  Administered 2012-12-09: 0.1 mg via ORAL
  Filled 2012-12-07 (×6): qty 1

## 2012-12-07 MED ORDER — PNEUMOCOCCAL VAC POLYVALENT 25 MCG/0.5ML IJ INJ
0.5000 mL | INJECTION | INTRAMUSCULAR | Status: AC
  Administered 2012-12-08: 0.5 mL via INTRAMUSCULAR
  Filled 2012-12-07: qty 0.5

## 2012-12-07 MED ORDER — ONDANSETRON HCL 4 MG/2ML IJ SOLN: 4.0000 mg | Freq: Four times a day (QID) | INTRAMUSCULAR | Status: DC | PRN

## 2012-12-07 NOTE — ED Provider Notes (Addendum)
CSN: [id number]     Arrival date & time 12/07/12  1539 History   First MD Initiated Contact with Patient 12/07/12 1603     Chief Complaint  Patient presents with  . Torticollis    HPI Courtney David is an 52 y.o. female with a past medical history significant for HTN, RSD involving mainly the left side s/p placement of spinal cord stimulator, s/p cervical spine fusion few years ago, who was in her usual state of health until 2 days ago when developed acute onset of severe sharp pain neck traveling to the shoulder blade and left UE, also associated with numbness left arm-forearm-hand except the left V finger. She said that today developed lack of control and weakness of the left arm.  Stated that the pain is constant, worsened by neck movements, and not improved by ibuprofen.  Denies recent neck.head trauma, headache, vertigo, double vision, difficulty swallowing, slurred speech, language or vision impairment.  Courtney David tells me that her cervical spine surgery was very successful and she didn't have any neck issues until 2 days ago.  CT cervical spine showed stable post operative changes without evidence of acute abnormality  Past Medical History  Diagnosis Date  . Hypertension   . RSD (reflex sympathetic dystrophy)     mainly left side/ right hand   Past Surgical History  Procedure Laterality Date  . Tmj arthroplasty    . Abdominal hysterectomy    . Ovary removed    . Neck fusion    . Shoulder surgery      screw right shoulder  . Wrist surgery      cyst removed from both wrists  . Spinal cord stimulator implant    . Knee arthroscopy      bil   Family History  Problem Relation Age of Onset  . Colon polyps Paternal Grandfather   . Heart disease Mother   . Diabetes Mother    History  Substance Use Topics  . Smoking status: Current Every Day Smoker -- 0.50 packs/day    Types: Cigarettes  . Smokeless tobacco: Never Used  . Alcohol Use: No   OB History   Grav Para Term  Preterm Abortions TAB SAB Ect Mult Living                 Review of Systems All other systems reviewed and are negative Allergies  Codeine and Sulfa antibiotics  Home Medications   Current Outpatient Rx  Name  Route  Sig  Dispense  Refill  . atorvastatin (LIPITOR) 40 MG tablet   Oral   Take 40 mg by mouth daily.         . cloNIDine (CATAPRES) 0.1 MG tablet   Oral   Take 0.1 mg by mouth 2 (two) times daily. Take 1/2 tab bid         . cyclobenzaprine (FLEXERIL) 5 MG tablet   Oral   Take 5 mg by mouth 3 (three) times daily as needed for muscle spasms.         [name] ibuprofen (ADVIL,MOTRIN) 200 MG tablet   Oral   Take 800 mg by mouth every 6 (six) hours as needed for pain.         [name] omeprazole (PRILOSEC) 40 MG capsule   Oral   Take 40 mg by mouth daily.           . Tapentadol HCl (NUCYNTA ER) 50 MG TB12   Oral   Take 50 mg by  mouth every 6 (six) hours as needed.         . topiramate (TOPAMAX) 25 MG tablet   Oral   Take 25 mg by mouth 2 (two) times daily. Pt takes 2 tablet in the morning and 2 tablet at night         . venlafaxine (EFFEXOR-XR) 75 MG 24 hr capsule   Oral   Take 75 mg by mouth daily.           . Vitamin D, Ergocalciferol, (DRISDOL) 50000 UNITS CAPS   Oral   Take 50,000 Units by mouth once a week.          BP 134/93  Pulse 73  Temp(Src) 98 F (36.7 C) (Oral)  Resp 16  Ht 5\' 6"  (1.676 m)  Wt 167 lb (75.751 kg)  BMI 26.97 kg/m2  SpO2 100% Physical Exam  Nursing note and vitals reviewed. Constitutional: She is oriented to person, place, and time. She appears well-developed and well-nourished. No distress.  HENT:  Head: Normocephalic and atraumatic.  Eyes: Pupils are equal, round, and reactive to light.  Neck: Normal range of motion.  Cardiovascular: Normal rate and intact distal pulses.   Pulmonary/Chest: No respiratory distress.  Abdominal: Normal appearance. She exhibits no distension.  Musculoskeletal: Normal range of motion.   Neurological: She is alert and oriented to person, place, and time. No cranial nerve deficit. Coordination and gait normal. GCS eye subscore is 4. GCS verbal subscore is 5. GCS motor subscore is 6.  Abnormal tricep weakness on the left  Skin: Skin is warm and dry. No rash noted.  Psychiatric: She has a normal mood and affect. Her behavior is normal.    ED Course  Procedures (including critical care time) Labs Review Labs Reviewed  CBC WITH DIFFERENTIAL  BASIC METABOLIC PANEL   Medications  ondansetron (ZOFRAN) injection 4 mg (4 mg Intravenous Given 12/07/12 2146)  methylPREDNISolone sodium succinate (SOLU-MEDROL) 40 mg/mL injection 40 mg (not administered)  ondansetron (ZOFRAN) injection 4 mg (4 mg Intravenous Given 12/07/12 1735)  fentaNYL (SUBLIMAZE) injection 100 mcg (100 mcg Intravenous Given 12/07/12 1739)  fentaNYL (SUBLIMAZE) injection 100 mcg (100 mcg Intravenous Given 12/07/12 2146)    Imaging Review Ct Cervical Spine Wo Contrast  12/07/2012   CLINICAL DATA:  Neck pain.  Left arm weakness.  EXAM: CT CERVICAL SPINE WITHOUT CONTRAST  TECHNIQUE: Multidetector CT imaging of the cervical spine was performed without intravenous contrast. Multiplanar CT image reconstructions were also generated.  COMPARISON:  02/27/2004 MRI.  FINDINGS: Prior anterior fusion at C5-6. Solid bony fusion across the disc space. Spinal stimulator device also noted within the spinal canal at this level. Normal alignment. Prevertebral soft tissues are normal. No fracture. No epidural or paraspinal hematoma. No neuroforaminal narrowing.  IMPRESSION: Postoperative changes as above. No acute bony abnormality.   Electronically Signed   By: Charlett Nose   On: 12/07/2012 18:33     Patient was seen and evaluated by neurology.  Phone consult was done to neurosurgery.  Plan this or admission with cervical myelogram done tomorrow morning.  Spoke with Dr. Evlyn Kanner from [facility] who requested hospitalist admit  patient. MDM   1. Left arm weakness   2. Hyperlipidemia   3. Hypertension   4. Weakness of left upper extremity        Courtney Shi, MD 12/07/12 2213  Courtney Shi, MD 12/07/12 2213

## 2012-12-07 NOTE — Consult Note (Signed)
NEURO HOSPITALIST CONSULT NOTE    Reason for Consult: neck-LUE pain with paresthesias and weakness.  HPI:                                                                                                                                          Courtney David is an 52 y.o. female with a past medical history significant for HTN, RSD involving mainly the left side s/p placement of spinal cord stimulator, s/p cervical spine fusion few years ago, who was in her usual state of health until 2 days ago when developed acute onset of severe sharp pain neck traveling to the shoulder blade and left UE, also associated with numbness left arm-forearm-hand except the left V finger. She said that today developed lack of control and weakness of the left arm. Stated that the pain is constant, worsened by neck movements, and not improved by ibuprofen. Denies recent neck.head trauma, headache, vertigo, double vision, difficulty swallowing, slurred speech, language or vision impairment. Courtney David tells me that her cervical spine surgery was very successful and she didn't have any neck issues until 2 days ago. CT cervical spine showed stable post operative changes without evidence of acute abnormality.    Past Medical History  Diagnosis Date  . Hypertension   . RSD (reflex sympathetic dystrophy)     mainly left side/ right hand    Past Surgical History  Procedure Laterality Date  . Tmj arthroplasty    . Abdominal hysterectomy    . Ovary removed    . Neck fusion    . Shoulder surgery      screw right shoulder  . Wrist surgery      cyst removed from both wrists  . Spinal cord stimulator implant    . Knee arthroscopy      bil    Family History  Problem Relation Age of Onset  . Colon polyps Paternal Grandfather   . Heart disease Mother   . Diabetes Mother     Social History:  reports that she has been smoking Cigarettes.  She has been smoking about 0.50 packs per day. She has  never used smokeless tobacco. She reports that she does not drink alcohol or use illicit drugs.  Allergies  Allergen Reactions  . Codeine Nausea And Vomiting    Nausea, vomiting  . Sulfa Antibiotics     Hives, itching    MEDICATIONS:  I have reviewed the patient's current medications.   ROS:                                                                                                                                       History obtained from the patient and chart review.  General ROS: negative for - chills, fatigue, fever, night sweats, weight gain or weight loss Psychological ROS: negative for - behavioral disorder, hallucinations, memory difficulties, mood swings or suicidal ideation Ophthalmic ROS: negative for - blurry vision, double vision, eye pain or loss of vision ENT ROS: negative for - epistaxis, nasal discharge, oral lesions, sore throat, tinnitus or vertigo Allergy and Immunology ROS: negative for - hives or itchy/watery eyes Hematological and Lymphatic ROS: negative for - bleeding problems, bruising or swollen lymph nodes Endocrine ROS: negative for - galactorrhea, hair pattern changes, polydipsia/polyuria or temperature intolerance Respiratory ROS: negative for - cough, hemoptysis, shortness of breath or wheezing Cardiovascular ROS: negative for - chest pain, dyspnea on exertion, edema or irregular heartbeat Gastrointestinal ROS: negative for - abdominal pain, diarrhea, hematemesis, nausea/vomiting or stool incontinence Genito-Urinary ROS: negative for - dysuria, hematuria, incontinence or urinary frequency/urgency Musculoskeletal ROS: negative for - joint swelling Neurological ROS: as noted in HPI Dermatological ROS: negative for rash and skin lesion changes     Physical exam: pleasant female in no apparent distress. Blood pressure 108/70, pulse 68,  temperature 98 F (36.7 C), temperature source Oral, resp. rate 16, height 5\' 6"  (1.676 m), weight 75.751 kg (167 lb), SpO2 99.00%. Head: normocephalic. Neck: supple on palpation but decreased range of motion and pain induced by neck movements. No bruits, no JVD. Cardiac: no murmurs. Lungs: clear. Abdomen: soft, no tender, no mass. Extremities: no edema.  Neurologic Examination:                                                                                                      Mental Status: Alert, oriented, thought content appropriate.  Speech fluent without evidence of aphasia.  Able to follow 3 step commands without difficulty. Cranial Nerves: II: Discs flat bilaterally; Visual fields grossly normal, pupils equal, round, reactive to light and accommodation III,IV, VI: ptosis not present, extra-ocular motions intact bilaterally V,VII: smile symmetric, facial light touch sensation normal bilaterally VIII: hearing normal bilaterally IX,X: gag reflex present XI: bilateral shoulder shrug XII: midline tongue extension Motor: Right : Upper extremity   5/5  Left:     Upper extremity   5/5 EXCEPT left triceps (  3/5) and intrinsic hand muscles.  Lower extremity   5/5   Lower extremity   5/5 Tone and bulk:normal tone throughout; no atrophy noted Sensory: Pinprick and light touch diminished mainly in the inner aspect left UE. Deep Tendon Reflexes:  2 all over  Plantars: Right: downgoing   Left: downgoing Cerebellar: normal finger-to-nose and normal heel-to-shin test in the right. No tested in the left side. Gait:  No tested. CV: pulses palpable throughout    No results found for this basename: cbc, bmp, coags, chol, tri, ldl, hga1c    No results found for this or any previous visit (from the past 48 hour(s)).  Ct Cervical Spine Wo Contrast  12/07/2012   CLINICAL DATA:  Neck pain.  Left arm weakness.  EXAM: CT CERVICAL SPINE WITHOUT CONTRAST  TECHNIQUE: Multidetector CT imaging of  the cervical spine was performed without intravenous contrast. Multiplanar CT image reconstructions were also generated.  COMPARISON:  02/27/2004 MRI.  FINDINGS: Prior anterior fusion at C5-6. Solid bony fusion across the disc space. Spinal stimulator device also noted within the spinal canal at this level. Normal alignment. Prevertebral soft tissues are normal. No fracture. No epidural or paraspinal hematoma. No neuroforaminal narrowing.  IMPRESSION: Postoperative changes as above. No acute bony abnormality.   Electronically Signed   By: Charlett Nose   On: 12/07/2012 18:33     Assessment/Plan: 52 y/o with prior cervical spine fusion, comes with complaining of new onset pain-paresthesias-weakness neck-LUE with a pattern described above. Neuro-exam remarkable for weakness involving the left triceps and intrinsic hand muscles, although there is pain involved. Further, decrease sensory involving mainly the inner aspect left UE. CT cervical spine unimpressive. Can not have MRI due to spinal cord stimulator. Suspect radiculopathy, but can not totally exclude some compromise cervical cord. Recommend: CT myelogram. Toradol IV for pain control. Neurosurgery already contacted by ED physician.    Wyatt Portela, MD [facility] [phone]  12/07/2012, 8:45 PM

## 2012-12-07 NOTE — ED Notes (Signed)
The pt has had a stiff neck since Wednesday and she has some lt arm numbness since this am.  She had cervical fusion approx 10 years ago

## 2012-12-07 NOTE — H&P (Signed)
[facility] History and Physical  [name] NWG:[id number] DOB: 1960-12-14 DOA: 12/07/2012  Referring physician: ER physician. PCP: Garlan Fillers, MD   Chief Complaint: left upper extremity weakness and pain.  HPI: Courtney David is a 52 y.o. female with history of hypertension hyperlipidemia ongoing tobacco abuse and previous history of neck surgery presented the ER because of worsening left upper extremity pain with weakness. Patient experienced some click like sound in her neck few days ago. Today morning when she woke up she started experiencing severe pain in the left upper extremity with weakness. Since it persisted patient came to the ER. CT head does not show anything acute. Neurologist on-call and neurosurgeon were consulted by the ER physician. At this time they have recommended admission and get a CT myelogram to rule out any cord lesions as patient has pain stimulator and cannot get MRI. On exam patient has left upper extremity weakness. Patient otherwise denies any weakness in the right upper extremity or lower extremities. Denies any incontinence of urine or bowel or any headache visual symptoms or any difficulty swallowing or speaking. Denies any chest pain or shortness of breath.  Review of Systems: As presented in the history of presenting illness, rest negative.  Past Medical History  Diagnosis Date  . Hypertension   . RSD (reflex sympathetic dystrophy)     mainly left side/ right hand   Past Surgical History  Procedure Laterality Date  . Tmj arthroplasty    . Abdominal hysterectomy    . Ovary removed    . Neck fusion    . Shoulder surgery      screw right shoulder  . Wrist surgery      cyst removed from both wrists  . Spinal cord stimulator implant    . Knee arthroscopy      bil   Social History:  reports that she has been smoking Cigarettes.  She has been smoking about 0.50 packs per day. She has never used smokeless tobacco. She reports that she  does not drink alcohol or use illicit drugs. Home. where does patient live-- Can do ADLs. Can patient participate in ADLs?  Allergies  Allergen Reactions  . Codeine Nausea And Vomiting    Nausea, vomiting  . Sulfa Antibiotics     Hives, itching    Family History  Problem Relation Age of Onset  . Colon polyps Paternal Grandfather   . Heart disease Mother   . Diabetes Mother       Prior to Admission medications   Medication Sig Start Date End Date Taking? Authorizing Provider  atorvastatin (LIPITOR) 40 MG tablet Take 40 mg by mouth daily.   Yes Historical Provider, MD  cloNIDine (CATAPRES) 0.1 MG tablet Take 0.1 mg by mouth 2 (two) times daily. Take 1/2 tab bid   Yes Historical Provider, MD  cyclobenzaprine (FLEXERIL) 5 MG tablet Take 5 mg by mouth 3 (three) times daily as needed for muscle spasms.   Yes Historical Provider, MD  ibuprofen (ADVIL,MOTRIN) 200 MG tablet Take 800 mg by mouth every 6 (six) hours as needed for pain.   Yes Historical Provider, MD  omeprazole (PRILOSEC) 40 MG capsule Take 40 mg by mouth daily.     Yes Historical Provider, MD  Tapentadol HCl (NUCYNTA ER) 50 MG TB12 Take 50 mg by mouth every 6 (six) hours as needed.   Yes Historical Provider, MD  topiramate (TOPAMAX) 25 MG tablet Take 25 mg by mouth 2 (two) times daily. Pt takes  2 tablet in the morning and 2 tablet at night   Yes Historical Provider, MD  venlafaxine (EFFEXOR-XR) 75 MG 24 hr capsule Take 75 mg by mouth daily.     Yes Historical Provider, MD  Vitamin D, Ergocalciferol, (DRISDOL) 50000 UNITS CAPS Take 50,000 Units by mouth once a week.   Yes Historical Provider, MD   Physical Exam: Filed Vitals:   12/07/12 1800 12/07/12 2000 12/07/12 2042 12/07/12 2100  BP: 108/70 105/64 120/70 134/93  Pulse: 68 66 63 73  Temp:      TempSrc:      Resp: 16     Height:      Weight:      SpO2: 99% 100% 100% 100%     General:  Well-developed and nourished.  Eyes: anicteric no pallor.  ENT: no discharge  from the ears eyes nose mouth.  Neck: no mass felt.  Cardiovascular: S1-S2 heard.  Respiratory: no rhonchi or crepitations.  Abdomen: soft nontender bowel sounds present.  Skin: no rash.  Musculoskeletal: no edema.  Psychiatric: appears normal.  Neurologic: alert awake oriented to time place and person. Patient has 3 x 5 strength in the left upper extremity. No facial asymmetry or tongue deviation.  Labs on Admission:  Basic Metabolic Panel: No results found for this basename: NA, K, CL, CO2, GLUCOSE, BUN, CREATININE, CALCIUM, MG, PHOS,  in the last 168 hours Liver Function Tests: No results found for this basename: AST, ALT, ALKPHOS, BILITOT, PROT, ALBUMIN,  in the last 168 hours No results found for this basename: LIPASE, AMYLASE,  in the last 168 hours No results found for this basename: AMMONIA,  in the last 168 hours CBC:  Recent Labs Lab 12/07/12 2150  WBC 7.9  NEUTROABS 5.6  HGB 14.7  HCT 42.6  MCV 90.1  PLT 190   Cardiac Enzymes: No results found for this basename: CKTOTAL, CKMB, CKMBINDEX, TROPONINI,  in the last 168 hours  BNP (last 3 results) No results found for this basename: PROBNP,  in the last 8760 hours CBG: No results found for this basename: GLUCAP,  in the last 168 hours  Radiological Exams on Admission: Ct Cervical Spine Wo Contrast  12/07/2012   CLINICAL DATA:  Neck pain.  Left arm weakness.  EXAM: CT CERVICAL SPINE WITHOUT CONTRAST  TECHNIQUE: Multidetector CT imaging of the cervical spine was performed without intravenous contrast. Multiplanar CT image reconstructions were also generated.  COMPARISON:  02/27/2004 MRI.  FINDINGS: Prior anterior fusion at C5-6. Solid bony fusion across the disc space. Spinal stimulator device also noted within the spinal canal at this level. Normal alignment. Prevertebral soft tissues are normal. No fracture. No epidural or paraspinal hematoma. No neuroforaminal narrowing.  IMPRESSION: Postoperative changes as  above. No acute bony abnormality.   Electronically Signed   By: Charlett Nose   On: 12/07/2012 18:33     Assessment/Plan Principal Problem:   Weakness of left upper extremity Active Problems:   Hypertension   Hyperlipidemia   1. Weakness of the left upper extremity - at this time symptoms are concerning for radiculopathy. Since there is a concern for cord lesions CT myelogram has been ordered as MRI cannot be done since patient has been stimulated. Dr. Phoebe Perch had requested patient to be placed on steroid. I have ordered one dose of Solu-Medrol further doses to be decided. Continue with pain relief medications and neuro checks. Further recommendations per neurologist and neurosurgeon. 2. Hypertension - continue home medications. 3. Hyperlipidemia - continue home  medications. 4. Tobacco abuse - advised to quit smoking.  Patient's chest x-ray and labs are pending.    Code Status: full code.  Family Communication: none.  Disposition Plan: admit to inpatient.    Haeven Nickle N. [facility] Pager [phone].  If 7PM-7AM, please contact night-coverage [email or web address] Password [email or web address] 12/07/2012, 10:05 PM

## 2012-12-08 ENCOUNTER — Inpatient Hospital Stay (HOSPITAL_COMMUNITY): Payer: BC Managed Care – PPO

## 2012-12-08 LAB — CBC WITH DIFFERENTIAL/PLATELET
Eosinophils Absolute: 0 10*3/uL (ref 0.0–0.7)
HCT: 45.9 % (ref 36.0–46.0)
Hemoglobin: 15.4 g/dL — ABNORMAL HIGH (ref 12.0–15.0)
Lymphs Abs: 0.8 10*3/uL (ref 0.7–4.0)
MCH: 30.6 pg (ref 26.0–34.0)
Monocytes Absolute: 0.2 10*3/uL (ref 0.1–1.0)
Monocytes Relative: 2 % — ABNORMAL LOW (ref 3–12)
Neutrophils Relative %: 89 % — ABNORMAL HIGH (ref 43–77)
RBC: 5.03 MIL/uL (ref 3.87–5.11)

## 2012-12-08 LAB — BASIC METABOLIC PANEL
CO2: 23 mEq/L (ref 19–32)
Chloride: 103 mEq/L (ref 96–112)
GFR calc non Af Amer: >90 mL/min (ref >90)
Glucose, Bld: 149 mg/dL — ABNORMAL HIGH (ref 70–99)
Potassium: 4 mEq/L (ref 3.5–5.1)
Sodium: 138 mEq/L (ref 135–145)

## 2012-12-08 MED ORDER — METHYLPREDNISOLONE SODIUM SUCC 40 MG IJ SOLR
40.0000 mg | Freq: Every day | INTRAMUSCULAR | Status: DC
  Administered 2012-12-08 – 2012-12-09 (×2): 40 mg via INTRAVENOUS
  Filled 2012-12-08 (×3): qty 1

## 2012-12-08 MED ORDER — IOHEXOL 300 MG/ML  SOLN
10.0000 mL | Freq: Once | INTRAMUSCULAR | Status: AC | PRN
  Administered 2012-12-08: 10 mL via INTRAVENOUS

## 2012-12-08 NOTE — Progress Notes (Signed)
[facility]  Progress note   Courtney David:[id number] DOB: [birth year] DOA: 12/07/2012  Referring physician: ER physician.  PCP: Garlan Fillers, MD  Chief Complaint: left upper extremity weakness and pain.  HPI: Courtney David is a 52 y.o.  female PMHx  HTN, HLD,RSD involving mainly the left side s/p placement of spinal cord stimulator, s/p cervical spine fusion few years ago, ongoing tobacco abuse. Presented to  ER because of worsening left upper extremity pain with weakness. Patient experienced some click like sound in her neck few days ago. Today morning when she woke up she started experiencing severe pain in the left upper extremity with weakness. Since it persisted patient came to the ER. CT head does not show anything acute. Neurologist on-call and neurosurgeon were consulted by the ER physician. At this time they have recommended admission and get a CT myelogram to rule out any cord lesions as patient has pain stimulator and cannot get MRI. On exam patient has left upper extremity weakness. Patient otherwise denies any weakness in the right upper extremity or lower extremities. Denies any incontinence of urine or bowel or any headache visual symptoms or any difficulty swallowing or speaking. Denies any chest pain or shortness of breath. TODAY patient [facility] at 34 [facility], had been evaluated by neurology therefore did not need to be seen. Did introduced myself and explained to patient that I had spoken with Dr. Greig Castilla (neurosurgery) and explained the neurology/neurosurgery plan      Past Medical History   Diagnosis  Date   .  Hypertension    .  RSD (reflex sympathetic dystrophy)      mainly left side/ right hand    Past Surgical History   Procedure  Laterality  Date   .  Tmj arthroplasty     .  Abdominal hysterectomy     .  Ovary removed     .  Neck fusion     .  Shoulder surgery       screw right shoulder   .  Wrist surgery       cyst removed from  both wrists   .  Spinal cord stimulator implant     .  Knee arthroscopy       bil    Social History: reports that she has been smoking Cigarettes. She has been smoking about 0.50 packs per day. She has never used smokeless tobacco. She reports that she does not drink alcohol or use illicit drugs.    Allergies   Allergen  Reactions   .  Codeine  Nausea And Vomiting     Nausea, vomiting   .  Sulfa Antibiotics      Hives, itching    Family History   Problem  Relation  Age of Onset   .  Colon polyps  Paternal Grandfather    .  Heart disease  Mother    .  Diabetes  Mother     Prior to Admission medications   Medication  Sig  Start Date  End Date  Taking?  Authorizing Provider   atorvastatin (LIPITOR) 40 MG tablet  Take 40 mg by mouth daily.    Yes  Historical Provider, MD   cloNIDine (CATAPRES) 0.1 MG tablet  Take 0.1 mg by mouth 2 (two) times daily. Take 1/2 tab bid    Yes  Historical Provider, MD   cyclobenzaprine (FLEXERIL) 5 MG tablet  Take 5 mg by mouth 3 (three) times daily as  needed for muscle spasms.    Yes  Historical Provider, MD   ibuprofen (ADVIL,MOTRIN) 200 MG tablet  Take 800 mg by mouth every 6 (six) hours as needed for pain.    Yes  Historical Provider, MD   omeprazole (PRILOSEC) 40 MG capsule  Take 40 mg by mouth daily.    Yes  Historical Provider, MD   Tapentadol HCl (NUCYNTA ER) 50 MG TB12  Take 50 mg by mouth every 6 (six) hours as needed.    Yes  Historical Provider, MD   topiramate (TOPAMAX) 25 MG tablet  Take 25 mg by mouth 2 (two) times daily. Pt takes 2 tablet in the morning and 2 tablet at night    Yes  Historical Provider, MD   venlafaxine (EFFEXOR-XR) 75 MG 24 hr capsule  Take 75 mg by mouth daily.    Yes  Historical Provider, MD   Vitamin D, Ergocalciferol, (DRISDOL) 50000 UNITS CAPS  Take 50,000 Units by mouth once a week.    Yes  Historical Provider, MD     Physical Exam:  Filed Vitals:    12/07/12 1800  12/07/12 2000  12/07/12 2042  12/07/12 2100   BP:   108/70  105/64  120/70  134/93   Pulse:  68  66  63  73   Temp:       TempSrc:       Resp:  16      Height:       Weight:       SpO2:  99%  100%  100%  100%    General:  Eyes:   ENT: Neck:  Cardiovascular: .  Respiratory:  Abdomen: .  Skin:  Musculoskeletal: Psychiatric: Neurologic:   Labs on Admission:  Basic Metabolic Panel:  No results found for this basename: NA, K, CL, CO2, GLUCOSE, BUN, CREATININE, CALCIUM, MG, PHOS, in the last 168 hours  Liver Function Tests:  No results found for this basename: AST, ALT, ALKPHOS, BILITOT, PROT, ALBUMIN, in the last 168 hours  No results found for this basename: LIPASE, AMYLASE, in the last 168 hours  No results found for this basename: AMMONIA, in the last 168 hours  CBC:   Recent Labs  Lab  12/07/12 2150   WBC  7.9   NEUTROABS  5.6   HGB  14.7   HCT  42.6   MCV  90.1   PLT  190     Cardiac Enzymes:  No results found for this basename: CKTOTAL, CKMB, CKMBINDEX, TROPONINI, in the last 168 hours  BNP (last 3 results)  No results found for this basename: PROBNP, in the last 8760 hours  CBG:  No results found for this basename: GLUCAP, in the last 168 hours  Radiological Exams on Admission:  Ct Cervical Spine Wo Contrast  12/07/2012 CLINICAL DATA: Neck pain. Left arm weakness. EXAM: CT CERVICAL SPINE WITHOUT CONTRAST TECHNIQUE: Multidetector CT imaging of the cervical spine was performed without intravenous contrast. Multiplanar CT image reconstructions were also generated. COMPARISON: 02/27/2004 MRI. FINDINGS: Prior anterior fusion at C5-6. Solid bony fusion across the disc space. Spinal stimulator device also noted within the spinal canal at this level. Normal alignment. Prevertebral soft tissues are normal. No fracture. No epidural or paraspinal hematoma. No neuroforaminal narrowing. IMPRESSION: Postoperative changes as above. No acute bony abnormality. Electronically Signed By: Charlett Nose On: 12/07/2012 18:33     Assessment/Plan  Principal Problem:  Weakness of left upper extremity  Active Problems:  Hypertension  Hyperlipidemia   1. Weakness of the left upper extremity - at this time symptoms are concerning for radiculopathy. Since there is a concern for cord lesions CT myelogram has been ordered as MRI cannot be done since patient has been stimulated. Dr. Phoebe Perch had requested patient to be placed on steroid. I have ordered one dose of Solu-Medrol further doses to be decided. Continue with pain relief medications and neuro checks. Further recommendations per neurologist and neurosurgeon. 2. Hypertension - continue home medications. 3. Hyperlipidemia - continue home medications. 4. Tobacco abuse - advised to quit smoking.  Patient's chest x-ray and labs are pending.    Code Status: full code.  Family Communication: none.  Disposition Plan: admit to inpatient.  Carolyne Littles .  [facility]  Pager [phone].  If 7PM-7AM, please contact night-coverage  [email or web address]  [email or web address]  12/07/2012, 10:05 PM

## 2012-12-08 NOTE — Progress Notes (Addendum)
Confirmed with Dr Margo Aye that patient is to lay supine today (for 4 hours post procedure), with bathroom privileges when needed

## 2012-12-08 NOTE — Consult Note (Signed)
Reason for Consult:Left arm pain weakness Referring Physician: Nelia Shi, MD   Courtney David is an 52 y.o. female.  HPI: pt has 2 day history of neck pain  radiating to left arm with whole left arm numbness and weakness.  Pt had 1 level ACF few years ago by Dr Jeral Fruit and has RSD with  Left sided sx's and has thoracic and cervical dorsal column stimulators placed.   Followed in [place] for that.    Pain was severe   - pt was admitted to hos[pital and cervical myelogram done.  PMH:  Past Medical History  Diagnosis Date  . Hypertension   . RSD (reflex sympathetic dystrophy)     mainly left side/ right hand    Past Surgical History  Procedure Laterality Date  . Tmj arthroplasty    . Abdominal hysterectomy    . Ovary removed    . Neck fusion    . Shoulder surgery      screw right shoulder  . Wrist surgery      cyst removed from both wrists  . Spinal cord stimulator implant    . Knee arthroscopy      bil    Family History:  Family History  Problem Relation Age of Onset  . Colon polyps Paternal Grandfather   . Heart disease Mother   . Diabetes Mother     Social History:  reports that she has been smoking Cigarettes.  She has been smoking about 0.50 packs per day. She has never used smokeless tobacco. She reports that she does not drink alcohol or use illicit drugs.  Allergies:  Allergies  Allergen Reactions  . Codeine Nausea And Vomiting    Nausea, vomiting  . Sulfa Antibiotics     Hives, itching    Prior to Admission medications   Medication Sig Start Date End Date Taking? Authorizing Provider  atorvastatin (LIPITOR) 40 MG tablet Take 40 mg by mouth daily.   Yes Historical Provider, MD  cloNIDine (CATAPRES) 0.1 MG tablet Take 0.1 mg by mouth 2 (two) times daily. Take 1/2 tab bid   Yes Historical Provider, MD  cyclobenzaprine (FLEXERIL) 5 MG tablet Take 5 mg by mouth 3 (three) times daily as needed for muscle spasms.   Yes Historical Provider, MD  ibuprofen  (ADVIL,MOTRIN) 200 MG tablet Take 800 mg by mouth every 6 (six) hours as needed for pain.   Yes Historical Provider, MD  omeprazole (PRILOSEC) 40 MG capsule Take 40 mg by mouth daily.     Yes Historical Provider, MD  Tapentadol HCl (NUCYNTA ER) 50 MG TB12 Take 50 mg by mouth every 6 (six) hours as needed.   Yes Historical Provider, MD  topiramate (TOPAMAX) 25 MG tablet Take 25 mg by mouth 2 (two) times daily. Pt takes 2 tablet in the morning and 2 tablet at night   Yes Historical Provider, MD  venlafaxine (EFFEXOR-XR) 75 MG 24 hr capsule Take 75 mg by mouth daily.     Yes Historical Provider, MD  Vitamin D, Ergocalciferol, (DRISDOL) 50000 UNITS CAPS Take 50,000 Units by mouth once a week.   Yes Historical Provider, MD    Results for orders placed during the hospital encounter of 12/07/12 (from the past 48 hour(s))  CBC WITH DIFFERENTIAL     Status: None   Collection Time    12/07/12  9:50 PM      Result Value Range   WBC 7.9  4.0 - 10.5 K/uL   RBC  4.73  3.87 - 5.11 MIL/uL   Hemoglobin 14.7  12.0 - 15.0 g/dL   HCT 21.3  08.6 - 57.8 %   MCV 90.1  78.0 - 100.0 fL   MCH 31.1  26.0 - 34.0 pg   MCHC 34.5  30.0 - 36.0 g/dL   RDW 46.9  62.9 - 52.8 %   Platelets 190  150 - 400 K/uL   Neutrophils Relative % 71  43 - 77 %   Neutro Abs 5.6  1.7 - 7.7 K/uL   Lymphocytes Relative 22  12 - 46 %   Lymphs Abs 1.7  0.7 - 4.0 K/uL   Monocytes Relative 6  3 - 12 %   Monocytes Absolute 0.5  0.1 - 1.0 K/uL   Eosinophils Relative 2  0 - 5 %   Eosinophils Absolute 0.1  0.0 - 0.7 K/uL   Basophils Relative 0  0 - 1 %   Basophils Absolute 0.0  0.0 - 0.1 K/uL  BASIC METABOLIC PANEL     Status: Abnormal   Collection Time    12/07/12  9:50 PM      Result Value Range   Sodium 139  135 - 145 mEq/L   Potassium 3.7  3.5 - 5.1 mEq/L   Chloride 105  96 - 112 mEq/L   CO2 25  19 - 32 mEq/L   Glucose, Bld 97  70 - 99 mg/dL   BUN 15  6 - 23 mg/dL   Creatinine, Ser 4.13  0.50 - 1.10 mg/dL   Calcium 8.9  8.4 - 24.4  mg/dL   GFR calc non Af Amer 83 (*) >90 mL/min   GFR calc Af Amer >90  >90 mL/min   Comment: (NOTE)     The eGFR has been calculated using the CKD EPI equation.     This calculation has not been validated in all clinical situations.     eGFR's persistently <90 mL/min signify possible Chronic Kidney     Disease.  BASIC METABOLIC PANEL     Status: Abnormal   Collection Time    12/08/12  7:00 AM      Result Value Range   Sodium 138  135 - 145 mEq/L   Potassium 4.0  3.5 - 5.1 mEq/L   Chloride 103  96 - 112 mEq/L   CO2 23  19 - 32 mEq/L   Glucose, Bld 149 (*) 70 - 99 mg/dL   BUN 14  6 - 23 mg/dL   Creatinine, Ser 0.10  0.50 - 1.10 mg/dL   Calcium 9.2  8.4 - 27.2 mg/dL   GFR calc non Af Amer >90  >90 mL/min   GFR calc Af Amer >90  >90 mL/min   Comment: (NOTE)     The eGFR has been calculated using the CKD EPI equation.     This calculation has not been validated in all clinical situations.     eGFR's persistently <90 mL/min signify possible Chronic Kidney     Disease.  CBC WITH DIFFERENTIAL     Status: Abnormal   Collection Time    12/08/12  7:00 AM      Result Value Range   WBC 8.0  4.0 - 10.5 K/uL   RBC 5.03  3.87 - 5.11 MIL/uL   Hemoglobin 15.4 (*) 12.0 - 15.0 g/dL   HCT 53.6  64.4 - 03.4 %   MCV 91.3  78.0 - 100.0 fL   MCH 30.6  26.0 - 34.0 pg  MCHC 33.6  30.0 - 36.0 g/dL   RDW 16.1  09.6 - 04.5 %   Platelets 234  150 - 400 K/uL   Neutrophils Relative % 89 (*) 43 - 77 %   Neutro Abs 7.1  1.7 - 7.7 K/uL   Lymphocytes Relative 9 (*) 12 - 46 %   Lymphs Abs 0.8  0.7 - 4.0 K/uL   Monocytes Relative 2 (*) 3 - 12 %   Monocytes Absolute 0.2  0.1 - 1.0 K/uL   Eosinophils Relative 0  0 - 5 %   Eosinophils Absolute 0.0  0.0 - 0.7 K/uL   Basophils Relative 0  0 - 1 %   Basophils Absolute 0.0  0.0 - 0.1 K/uL    Dg Chest 2 View  12/07/2012   CLINICAL DATA:  Left upper chest pain.  EXAM: CHEST  2 VIEW  COMPARISON:  None.  FINDINGS: Heart and mediastinal contours are within normal  limits. No focal opacities or effusions. No acute bony abnormality. Spinal stimulator device noted in place.  IMPRESSION: No active cardiopulmonary disease.   Electronically Signed   By: Charlett Nose   On: 12/07/2012 22:43   Ct Cervical Spine Wo Contrast  12/07/2012   CLINICAL DATA:  Neck pain.  Left arm weakness.  EXAM: CT CERVICAL SPINE WITHOUT CONTRAST  TECHNIQUE: Multidetector CT imaging of the cervical spine was performed without intravenous contrast. Multiplanar CT image reconstructions were also generated.  COMPARISON:  02/27/2004 MRI.  FINDINGS: Prior anterior fusion at C5-6. Solid bony fusion across the disc space. Spinal stimulator device also noted within the spinal canal at this level. Normal alignment. Prevertebral soft tissues are normal. No fracture. No epidural or paraspinal hematoma. No neuroforaminal narrowing.  IMPRESSION: Postoperative changes as above. No acute bony abnormality.   Electronically Signed   By: Charlett Nose   On: 12/07/2012 18:33   Ct Cervical Spine W Contrast  12/08/2012   CLINICAL DATA:  52 year old female with left upper extremity pain, numbness and weakness. History of cervical spine fusion and cervical spinal stimulator placement.  EXAM: CT CERVICAL SPINE WITH CONTRAST  TECHNIQUE: Multidetector CT imaging of the cervical spine was performed during intravenous contrast administration. Multiplanar CT image reconstructions were also generated.  CONTRAST:  10 mL Omnipaque 300 intrathecal contrast, see cervical myelogram PROCEDURE reported separately.  COMPARISON:  Cervical spine CT without contrast 12/07/2012. Cervical spine MRI 02/27/2004.  FINDINGS: Intrathecal and subarachnoid contrast is present. Negative visualized brain parenchyma. Insert sinuses No acute osseous abnormality identified. Insert paraspinal sequelae of ACDF and lower cervical spinal canal spinal stimulator placement, details below.  C2-C3: Negative.  C3-C4: Trace anterolisthesis of C3 on C4. Central disc  protrusion. Mild spinal stenosis with minimal mass effect on the ventral spinal cord (series 3, image 43). Left uncovertebral hypertrophy. Mild to moderate left C4 foraminal stenosis.  C4-C5: Circumferential disc osteophyte complex. Right eccentric broad-based component of disc. Mild ligament flavum hypertrophy. Spinal stenosis with spinal cord flattening (estimated AP thecal sac 6 mm, series 3 image 52). Right greater than left uncovertebral hypertrophy. Mild left and moderate to severe right C5 foraminal stenosis.  C5-C6: ACDF. Hardware intact without evidence of loosening. Solid interbody arthrodesis. Posteriorly the metal lead portion of the spinal stimulator device is present in the posterior epidural space, tip at the level of the C5 cortical screw. Right paracentral C5 osteophytosis (series 3, image 55). Endplate osteophytosis at the level of the disc. Combined there is narrowing of the AP thecal sac 2  5-6 mm (series 2, image 54 and 62). No foraminal stenosis.  C6-C7: Circumferential disc osteophyte complex. Metal lead portion of the spinal stimulator device present in the posterior epidural space at this level, the inferior extent of the lead corresponding to the C7 pedicle level. From hardware streak artifact, the thecal sac to talus diminished. There does appear to be multifactorial spinal stenosis, estimated AP thecal sac reduced to 5 mm. No foraminal stenosis at this level.  C7-T1: Non metal portion of the spinal stimulator lead in the posterior epidural space in continuing into the upper thoracic epidural space. No disc herniation or stenosis.  T1-T2 level: Negative except for posterior epidural space stimulator lead.  IMPRESSION: 1. C5-C6 ACDF with solid interbody arthrodesis and no hardware loosening. Posterior lower cervical spine epidural space metallic stimulator lead present from the C5 pedicle to the C7 pedicle level. There does appear to be a degree of spinal stenosis at C5 and C6 related in  part to endplate osteophytes. AP thecal sac is reduced to 5-6 mm, see series 2, images 54 and 62.  2. 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 Spinal cord mass effect at C4-C5 is present and greater on the right. Thecal sac detail is limited at C6-C7 due to hardware streak artifact, but flattening of the spinal cord there also is suspected. There is multifactorial moderate to severe right C5 foraminal stenosis.  3. Trace spondylolisthesis at C3-C4 with small central disc protrusion. Mild spinal stenosis with minimal mass effect on the spinal cord. Mild to moderate left C4 foraminal stenosis.   Electronically Signed   By: Augusto Gamble   On: 12/08/2012 14:00   Dg Myelogram Cervical  12/08/2012   CLINICAL DATA:  52 year old female with left upper extremity numbness and pain affecting the entire extremity. History of cervical spine ACDF and cervical and lumbar spinal stimulators.  EXAM: MYELOGRAM CERVICAL  TECHNIQUE: MYELOGRAM INJECTION TECHNIQUE: Informed consent was obtained from the patient prior to the procedure, including potential complications of headache, allergy, infection and pain.  With the patient prone, the lower back was prepped with Betadine.  1% Lidocaine was used for local anesthesia.  Lumbar puncture was performed with fluoroscopic guidance at the L3-L4 level using right sub laminar technique and a 3.5 inch x 22 gauge needle with return of clear, colorless CSF. Contrast was connected and 10 mL of Omnipaque 300 was slowly injected into the lumbar subarachnoid space. The needle was withdrawn, direct pressure was held, and hemostasis was noted.  Utilizing gravity, contrast was then transmitted into the cervical spine.  Following injection of intrathecal Omnipaque contrast, spine imaging in multiple projections was performed using fluoroscopy.  COMPARISON:  Chest radiographs 12/07/2012. CT Abdomen and Pelvis 04/10/2003.  FLUOROSCOPY TIME:  2 min and 44 seconds.  FINDINGS:  Incidental lumbar transitional anatomy, with sacralized L5 level. Normal thoracic segmentation. Incidental lumbar spinal stimulator leads, with a caudal lead at the L5 level and a cephalad lumbar lead at L1-L2. Negative visualized lumbar thecal sac.  C5-C6 ACDF hardware and posterior lower cervical epidural space spinal stimulator device, with metal lead extending from the C5 to the C7 level.  Due to limited range of motion in the neck, a right lateral cervical spine myelographic imaging only was obtained. Evidence of C4-C5 endplate spurring. The C5-C6 hardware appears intact. Contrast was refluxed to the cervicomedullary junction.  IMPRESSION: 1. Status post C5-C6 ACDF and posterior cervical spinal stimulator device placed extending from the C5 to the C7 level.  2. Suspect adjacent segment disease at C4-C5 with a degree of spinal stenosis. No myelographic block in countered.  3.  See post myelogram CT findings reported separately.  4. Lumbar spine or stimulator device is incidentally noted. 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   Electronically Signed   By: Augusto Gamble   On: 12/08/2012 13:41    ROS:  History obtained from the patient and chart review.  General ROS: negative for - chills, fatigue, fever, night sweats, weight gain or weight loss  Psychological ROS: negative for - behavioral disorder, hallucinations, memory difficulties, mood swings or suicidal ideation  Ophthalmic ROS: negative for - blurry vision, double vision, eye pain or loss of vision  ENT ROS: negative for - epistaxis, nasal discharge, oral lesions, sore throat, tinnitus or vertigo  Allergy and Immunology ROS: negative for - hives or itchy/watery eyes  Hematological and Lymphatic ROS: negative for - bleeding problems, bruising or swollen lymph nodes  Endocrine ROS: negative for - galactorrhea, hair pattern changes, polydipsia/polyuria or temperature intolerance  Respiratory ROS: negative for - cough,  hemoptysis, shortness of breath or wheezing  Cardiovascular ROS: negative for - chest pain, dyspnea on exertion, edema or irregular heartbeat  Gastrointestinal ROS: negative for - abdominal pain, diarrhea, hematemesis, nausea/vomiting or stool incontinence  Genito-Urinary ROS: negative for - dysuria, hematuria, incontinence or urinary frequency/urgency  Musculoskeletal ROS: negative for - joint swelling  Neurological ROS: as noted in HPI  Dermatological ROS: negative for rash and skin lesion changes    Blood pressure 112/70, pulse 69, temperature 98.4 F (36.9 C), temperature source Oral, resp. rate 18, height 5\' 6"  (1.676 m), weight 72.802 kg (160 lb 8 oz), SpO2 96.00%. PE Good ROM neck, some pain at the extremes  - negative Spurling, negative lhermitte Good ROM shoulders, bilat  - motor  5/5 all except some weakness in Left tricept -    RAM ok - no Finger jerk, no hoffmans .    Sensation sl diminished Left C7/C8  DTR 2/4 all  Assessment/Plan: Pt with some stenosis at C4-5  , but worse on the right and pt with Left sided symptoms  -  No significant NR compression in lower cervical area  -    I don't think any urgent surgical intervention needed  - would treat symptoms with NSAID's, steroids, muscle relaxer and Pain meds if needed  -  Would have pt follow up with pain management  Doctor to eval for a problem with the stimulator, or if some programming could help symptoms ,  and  If not improved  - pt should follow up with Dr Jeral Fruit in 2-3 weeks  Thanks  -  recall if we can be of further assistance  Clydene Fake, MD 12/08/2012, 4:07 PM

## 2012-12-08 NOTE — Procedures (Signed)
.  Procedure: Lumbar Puncture and cervical myelogram with fluoroscopy. Specimen: none Bleeding: minial. Complications: None immediate. Patient   -Condition: Stable.  -Disposition:  Inpatient, to floor.  Full Radiology Report to follow under IMAGING

## 2012-12-09 DIAGNOSIS — G9050 Complex regional pain syndrome I, unspecified: Secondary | ICD-10-CM | POA: Diagnosis present

## 2012-12-09 DIAGNOSIS — F17200 Nicotine dependence, unspecified, uncomplicated: Secondary | ICD-10-CM | POA: Diagnosis present

## 2012-12-09 MED ORDER — TAPENTADOL HCL 50 MG PO TABS
50.0000 mg | ORAL_TABLET | Freq: Four times a day (QID) | ORAL | Status: DC | PRN
  Administered 2012-12-09: 50 mg via ORAL
  Filled 2012-12-09: qty 1

## 2012-12-09 MED ORDER — PREDNISONE 10 MG PO TABS: ORAL_TABLET | ORAL | Status: DC

## 2012-12-09 MED ORDER — PREDNISONE 50 MG PO TABS: ORAL_TABLET | ORAL | Status: DC

## 2012-12-09 MED ORDER — PREDNISONE 5 MG PO TABS: ORAL_TABLET | ORAL | Status: DC

## 2012-12-09 MED ORDER — PREDNISONE 20 MG PO TABS: ORAL_TABLET | ORAL | Status: DC

## 2012-12-09 NOTE — Discharge Summary (Signed)
[facility]  Discharge note   TISH BEGIN ZOX:[id number] DOB: May 13, 1960 DOA: 12/07/2012  Referring physician: ER physician.  PCP: Garlan Fillers, MD  Chief Complaint: left upper extremity weakness and pain.  HPI: TALLI KIMMER is a 52 y.o.  female PMHx  HTN, HLD,RSD involving mainly the left side s/p placement of spinal cord stimulator, s/p cervical spine fusion few years ago, ongoing tobacco abuse. Presented to  ER because of worsening left upper extremity pain with weakness. Patient experienced some click like sound in her neck few days ago. Today morning when she woke up she started experiencing severe pain in the left upper extremity with weakness. Since it persisted patient came to the ER. CT head does not show anything acute. Neurologist on-call and neurosurgeon were consulted by the ER physician. At this time they have recommended admission and get a CT myelogram to rule out any cord lesions as patient has pain stimulator and cannot get MRI. On exam patient has left upper extremity weakness. Patient otherwise denies any weakness in the right upper extremity or lower extremities. Denies any incontinence of urine or bowel or any headache visual symptoms or any difficulty swallowing or speaking. Denies any chest pain or shortness of breath. 12/08/2012 patient [facility] at 0351 [facility], had been evaluated by neurology therefore did not need to be seen. Did introduced myself and explained to patient that I had spoken with Dr. Colon Branch (neurosurgery) and explained the neurology/neurosurgery plan TODAY able to move her left arm and shoulder with minimal pain. Ready for discharge    Procedure CT cervical spine with contrast 12/08/2012 IMPRESSION:  1. C5-C6 ACDF with solid interbody arthrodesis and no hardware  loosening. Posterior lower cervical spine epidural space metallic  stimulator lead present from the C5 pedicle to the C7 pedicle level.  There does appear to be a  degree of spinal stenosis at C5 and C6  related in part to endplate osteophytes. AP thecal sac is reduced to  5-6 mm, see series 2, images 54 and 62.  2. 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  Spinal cord mass effect at C4-C5 is present and greater on the  right. Thecal sac detail is limited at C6-C7 due to hardware streak  artifact, but flattening of the spinal cord there also is suspected.  There is multifactorial moderate to severe right C5 foraminal  stenosis.  3. Trace spondylolisthesis at C3-C4 with small central disc  protrusion. Mild spinal stenosis with minimal mass effect on the  spinal cord. Mild to moderate left C4 foraminal stenosis.  Cervical myelogram 12/08/2012 IMPRESSION:  1. Status post C5-C6 ACDF and posterior cervical spinal stimulator  device placed extending from the C5 to the C7 level.  2. Suspect adjacent segment disease at C4-C5 with a degree of spinal  stenosis. No myelographic block in countered.  3. See post myelogram CT findings reported separately.  4. Lumbar spine or stimulator device is incidentally noted.  Incidental transitional lumbosacral anatomy with a sacralized L5  level.    Past Medical History   Diagnosis  Date   .  Hypertension    .  RSD (reflex sympathetic dystrophy)      mainly left side/ right hand    Past Surgical History   Procedure  Laterality  Date   .  Tmj arthroplasty     .  Abdominal hysterectomy     .  Ovary removed     .  Neck fusion     .  Shoulder surgery       screw right shoulder   .  Wrist surgery       cyst removed from both wrists   .  Spinal cord stimulator implant     .  Knee arthroscopy       bil    Social History: reports that she has been smoking Cigarettes. She has been smoking about 0.50 packs per day. She has never used smokeless tobacco. She reports that she does not drink alcohol or use illicit drugs.    Allergies   Allergen  Reactions   .  Codeine  Nausea And Vomiting      Nausea, vomiting   .  Sulfa Antibiotics      Hives, itching    Family History   Problem  Relation  Age of Onset   .  Colon polyps  Paternal Grandfather    .  Heart disease  Mother    .  Diabetes  Mother     Prior to Admission medications   Medication  Sig  Start Date  End Date  Taking?  Authorizing Provider   atorvastatin (LIPITOR) 40 MG tablet  Take 40 mg by mouth daily.    Yes  Historical Provider, MD   cloNIDine (CATAPRES) 0.1 MG tablet  Take 0.1 mg by mouth 2 (two) times daily. Take 1/2 tab bid    Yes  Historical Provider, MD   cyclobenzaprine (FLEXERIL) 5 MG tablet  Take 5 mg by mouth 3 (three) times daily as needed for muscle spasms.    Yes  Historical Provider, MD   ibuprofen (ADVIL,MOTRIN) 200 MG tablet  Take 800 mg by mouth every 6 (six) hours as needed for pain.    Yes  Historical Provider, MD   omeprazole (PRILOSEC) 40 MG capsule  Take 40 mg by mouth daily.    Yes  Historical Provider, MD   Tapentadol HCl (NUCYNTA ER) 50 MG TB12  Take 50 mg by mouth every 6 (six) hours as needed.    Yes  Historical Provider, MD   topiramate (TOPAMAX) 25 MG tablet  Take 25 mg by mouth 2 (two) times daily. Pt takes 2 tablet in the morning and 2 tablet at night    Yes  Historical Provider, MD   venlafaxine (EFFEXOR-XR) 75 MG 24 hr capsule  Take 75 mg by mouth daily.    Yes  Historical Provider, MD   Vitamin D, Ergocalciferol, (DRISDOL) 50000 UNITS CAPS  Take 50,000 Units by mouth once a week.    Yes  Historical Provider, MD     Physical Exam:  Filed Vitals:    12/07/12 1800  12/07/12 2000  12/07/12 2042  12/07/12 2100   BP:  108/70  105/64  120/70  134/93   Pulse:  68  66  63  73   Temp:       TempSrc:       Resp:  16      Height:       Weight:       SpO2:  99%  100%  100%  100%    General: Alert, NAD Eyes: Pupils equal reactive to light and accommodation   Neck: Neck mildly tender to palpation however patient able to have fluid movement of her head without serious  pain Cardiovascular: . Regular rhythm and rate, negative murmurs rubs gallops, DP/PT 1+ bilateral  Respiratory: Clear to auscultation bilateral  Abdomen: . Soft nontender nondistended plus bowel sounds Skin: Mottled purplish color  on left ankle consistent with RSD Musculoskeletal: Left calf and thigh with some atrophy again consistent with RSD, mild pain to palpation and movement of left knee, left ankle has moderate pain with movement and palpation again both consistent with RSD  Neurologic: Pupils equal round reactive to light and accommodation, cranial nerves II through XII intact, left upper extremity strength and 4/5, decreased grip strength, left lower extremity strength 4/5 patient unable to plantar flex with left foot, mild decreased range of motion with rotation of head, mild decreased range of motion with lateral bending of head   Labs on Admission:  Basic Metabolic Panel:  No results found for this basename: NA, K, CL, CO2, GLUCOSE, BUN, CREATININE, CALCIUM, MG, PHOS, in the last 168 hours  Liver Function Tests:  No results found for this basename: AST, ALT, ALKPHOS, BILITOT, PROT, ALBUMIN, in the last 168 hours  No results found for this basename: LIPASE, AMYLASE, in the last 168 hours  No results found for this basename: AMMONIA, in the last 168 hours  CBC:   Recent Labs  Lab  12/07/12 2150   WBC  7.9   NEUTROABS  5.6   HGB  14.7   HCT  42.6   MCV  90.1   PLT  190     Cardiac Enzymes:  No results found for this basename: CKTOTAL, CKMB, CKMBINDEX, TROPONINI, in the last 168 hours  BNP (last 3 results)  No results found for this basename: PROBNP, in the last 8760 hours  CBG:  No results found for this basename: GLUCAP, in the last 168 hours  Radiological Exams on Admission:  Ct Cervical Spine Wo Contrast  12/07/2012 CLINICAL DATA: Neck pain. Left arm weakness. EXAM: CT CERVICAL SPINE WITHOUT CONTRAST TECHNIQUE: Multidetector CT imaging of the cervical spine was performed  without intravenous contrast. Multiplanar CT image reconstructions were also generated. COMPARISON: 02/27/2004 MRI. FINDINGS: Prior anterior fusion at C5-6. Solid bony fusion across the disc space. Spinal stimulator device also noted within the spinal canal at this level. Normal alignment. Prevertebral soft tissues are normal. No fracture. No epidural or paraspinal hematoma. No neuroforaminal narrowing. IMPRESSION: Postoperative changes as above. No acute bony abnormality. Electronically Signed By: Charlett Nose On: 12/07/2012 18:33    Assessment/Plan  Principal Problem:  Weakness of left upper extremity  Active Problems:  Hypertension  Hyperlipidemia   1. Weakness of the left upper extremity/RSD - CT myelogram has been com[pleted and read by Dr Phoebe Perch. Recommendation No urgent surgical intervention needed - would treat symptoms with  NSAID's,  steroids, muscle relaxer and Pain meds. We'll continue patient on Solu-Medrol 40 mg daily,  and have her followup with Dr. Blanche East in 2 weeks or sooner if required by continued pain  2. Pain Control patient on pain contract and will need to return to her pain management physician for     additional pain medication or adjustment of spinal cord stimulator.  3. HTN; stable patient continue home medications  4. HLD; no recent cholesterol panel drawn will need to address with PCP, for time being maintain      home medication  5. tobacco dependence; patient counseled to stop smoking, and instructed to address adjunctive     Therapy with PCP    Patient's chest x-ray and labs are pending.    Code Status: full code.  Family Communication: none.  Disposition Plan:     Carolyne Littles .  [facility]  Pager [phone].  If 7PM-7AM, please contact night-coverage  [email or web address]  Password [email or web address]  12/07/2012, 10:05 PM

## 2012-12-09 NOTE — Progress Notes (Signed)
Left message with Case Management to set up home health PT,  to please call me when it's set up

## 2012-12-09 NOTE — Evaluation (Signed)
Physical Therapy Evaluation Patient Details Name: Courtney David MRN: [id number] DOB: Sep 15, 1960 Today's Date: 12/09/2012 Time: 2130-8657 PT Time Calculation (min): 22 min  PT Assessment / Plan / Recommendation History of Present Illness  pt has 2 day history of neck pain  radiating to left arm with whole left arm numbness and weakness.  Pt had 1 level ACF few years ago by Dr Jeral Fruit and has RSD with  Left sided sx's and has thoracic and cervical dorsal column stimulators placed.   Clinical Impression  Patient was fairly mobile today with use of Rt Lofstrand crutch.  Patient unable to use Lt crutch due to pain and weakness in Lt hand.  Good balance with gait.  Should be safe to return home.  Recommend HHPT for continued therapy and home safety evaluation.  Encouraged patient to minimize use of stairs until HHPT can work with her.      PT Assessment  Patient needs continued PT services    Follow Up Recommendations  Home health PT;Supervision - Intermittent    Does the patient have the potential to tolerate intense rehabilitation      Barriers to Discharge Decreased caregiver support Roommate works.  Patient's mother can assist during day as needed.    Equipment Recommendations   (Tub bench - patient states she wants to obtain)    Recommendations for Other Services     Frequency Min 3X/week    Precautions / Restrictions Precautions Precautions: Fall Restrictions Weight Bearing Restrictions: No   Pertinent Vitals/Pain       Mobility  Bed Mobility Bed Mobility: Supine to Sit;Sitting - Scoot to Edge of Bed;Sit to Supine Supine to Sit: 7: Independent Sitting - Scoot to Edge of Bed: 7: Independent Sit to Supine: 7: Independent Transfers Transfers: Sit to Stand;Stand to Sit Sit to Stand: 5: Supervision;With upper extremity assist;From bed Stand to Sit: 5: Supervision;With upper extremity assist;To bed Details for Transfer Assistance: Supervision for safety  only. Ambulation/Gait Ambulation/Gait Assistance: 5: Supervision Ambulation Distance (Feet): 24 Feet Assistive device: Lofstrands (Using Rt crutch only) Ambulation/Gait Assistance Details: Patient able to ambulate short distances using Rt. Loftstrand crutch with supervision.  Limited by pain in LLE. Gait Pattern: Step-to pattern;Decreased stance time - left;Decreased step length - right;Antalgic Gait velocity: Slow gait speed    Exercises     PT Diagnosis: Difficulty walking;Abnormality of gait;Acute pain;Generalized weakness  PT Problem List: Decreased strength;Decreased activity tolerance;Decreased balance;Decreased mobility;Cardiopulmonary status limiting activity;Pain PT Treatment Interventions: DME instruction;Gait training;Functional mobility training;Patient/family education     PT Goals(Current goals can be found in the care plan section) Acute Rehab PT Goals Patient Stated Goal: To go home today PT Goal Formulation: With patient Time For Goal Achievement: 12/16/12 Potential to Achieve Goals: Good  Visit Information  Last PT Received On: 12/09/12 Assistance Needed: +1 History of Present Illness: pt has 2 day history of neck pain  radiating to left arm with whole left arm numbness and weakness.  Pt had 1 level ACF few years ago by Dr Jeral Fruit and has RSD with  Left sided sx's and has thoracic and cervical dorsal column stimulators placed.        Prior Functioning  Home Living Family/patient expects to be discharged to:: Private residence Living Arrangements: Spouse/significant other Available Help at Discharge: Family;Available PRN/intermittently (Mother can come during day) Type of Home: House Home Access: Stairs to enter [employer] of Steps: 2 Entrance Stairs-Rails: None Home Layout: Two level;1/2 bath on main level;Bed/bath upstairs Alternate Level Stairs-Number of  Steps: flight Alternate Level Stairs-Rails: Right Home Equipment: Crutches;Wheelchair -  power (Forearm crutches) Prior Function Level of Independence: Independent with assistive device(s) Communication Communication: No difficulties    Cognition  Cognition Arousal/Alertness: Awake/alert Behavior During Therapy: WFL for tasks assessed/performed Overall Cognitive Status: Within Functional Limits for tasks assessed    Extremity/Trunk Assessment Upper Extremity Assessment Upper Extremity Assessment: LUE deficits/detail LUE Deficits / Details: Strength grossly 4/5 LUE: Unable to fully assess due to pain LUE Sensation: decreased light touch (3rd through 5th fingers and lateral hand) Lower Extremity Assessment Lower Extremity Assessment: LLE deficits/detail LLE Deficits / Details: Strength grossly 4/5 LLE: Unable to fully assess due to pain   Balance    End of Session PT - End of Session Equipment Utilized During Treatment: Gait belt Activity Tolerance: Patient limited by pain;Patient limited by fatigue Patient left: in bed;with call bell/phone within reach Nurse Communication: Mobility status  GP     Vena Austria 12/09/2012, 1:57 PM Durenda Hurt. Renaldo Fiddler, [facility] Acute Rehab Services Pager [phone]

## 2012-12-09 NOTE — Progress Notes (Signed)
   CARE MANAGEMENT NOTE 12/09/2012  Patient:  [facility]   Account Number:  [id number]  Date Initiated:  12/09/2012  Documentation initiated by:  [facility]  Subjective/Objective Assessment:   adm w/ left upper extremity weakness and pain     Action/Plan:   Anticipated DC Date:  12/09/2012   Anticipated DC Plan:  HOME W HOME HEALTH SERVICES         [facility] Choice  HOME HEALTH   Choice offered to / List presented to:  C-1 Patient        HH arranged  HH-2 PT      [facility] agency  [facility]   Status of service:  Completed, signed off Medicare Important Message given?   (If response is "NO", the following Medicare IM given date fields will be blank) Date Medicare IM given:   Date Additional Medicare IM given:    Discharge Disposition:  HOME W HOME HEALTH SERVICES  Per UR Regulation:    If discussed at Long Length of Stay Meetings, dates discussed:    Comments:  12/09/12 17:15 CM spoke with pt for choice.  Pt chose [facility] for HHPT.  Referral was faxed to [facility] for HHPT.  No other needs were communicated.  Freddy Jaksch, BSN, CM

## 2012-12-25 ENCOUNTER — Other Ambulatory Visit: Payer: Self-pay | Admitting: Neurosurgery

## 2012-12-26 ENCOUNTER — Other Ambulatory Visit: Payer: Self-pay | Admitting: Neurosurgery

## 2013-01-01 ENCOUNTER — Encounter (HOSPITAL_COMMUNITY): Payer: Self-pay | Admitting: Respiratory Therapy

## 2013-01-04 ENCOUNTER — Encounter (HOSPITAL_COMMUNITY)
Admission: RE | Admit: 2013-01-04 | Discharge: 2013-01-04 | Disposition: A | Discharge status: Home / Self Care | Payer: BC Managed Care – PPO | Source: Ambulatory Visit | Attending: Neurosurgery | Admitting: Neurosurgery

## 2013-01-04 ENCOUNTER — Encounter (HOSPITAL_COMMUNITY): Payer: Self-pay

## 2013-01-04 DIAGNOSIS — Z01818 Encounter for other preprocedural examination: Secondary | ICD-10-CM | POA: Insufficient documentation

## 2013-01-04 DIAGNOSIS — Z01812 Encounter for preprocedural laboratory examination: Secondary | ICD-10-CM | POA: Insufficient documentation

## 2013-01-04 DIAGNOSIS — Z01810 Encounter for preprocedural cardiovascular examination: Secondary | ICD-10-CM | POA: Insufficient documentation

## 2013-01-04 HISTORY — DX: Hyperlipidemia, unspecified: E78.5

## 2013-01-04 HISTORY — DX: Family history of other specified conditions: Z84.89

## 2013-01-04 HISTORY — DX: Other muscle spasm: M62.838

## 2013-01-04 HISTORY — DX: Pain in unspecified joint: M25.50

## 2013-01-04 HISTORY — DX: Constipation, unspecified: K59.00

## 2013-01-04 HISTORY — DX: Frequency of micturition: R35.0

## 2013-01-04 HISTORY — DX: Other specified postprocedural states: Z98.890

## 2013-01-04 HISTORY — DX: Paresthesia of skin: R20.2

## 2013-01-04 HISTORY — DX: Effusion, unspecified joint: M25.40

## 2013-01-04 HISTORY — DX: Personal history of other diseases of the nervous system and sense organs: Z86.69

## 2013-01-04 HISTORY — DX: Gastro-esophageal reflux disease without esophagitis: K21.9

## 2013-01-04 HISTORY — DX: Unspecified osteoarthritis, unspecified site: M19.90

## 2013-01-04 HISTORY — DX: Dorsalgia, unspecified: M54.9

## 2013-01-04 HISTORY — DX: Other amnesia: R41.3

## 2013-01-04 HISTORY — DX: Personal history of other diseases of the musculoskeletal system and connective tissue: Z87.39

## 2013-01-04 HISTORY — DX: Other specified postprocedural states: R11.2

## 2013-01-04 HISTORY — DX: Dizziness and giddiness: R42

## 2013-01-04 HISTORY — DX: Anesthesia of skin: R20.0

## 2013-01-04 LAB — CBC
MCH: 30.5 pg (ref 26.0–34.0)
MCHC: 33.9 g/dL (ref 30.0–36.0)
MCV: 89.9 fL (ref 78.0–100.0)
Platelets: 192 10*3/uL (ref 150–400)
WBC: 5.6 10*3/uL (ref 4.0–10.5)

## 2013-01-04 LAB — BASIC METABOLIC PANEL
CO2: 21 mEq/L (ref 19–32)
Calcium: 8.6 mg/dL (ref 8.4–10.5)
Creatinine, Ser: 0.63 mg/dL (ref 0.50–1.10)
GFR calc non Af Amer: >90 mL/min (ref >90)
Potassium: 4.1 mEq/L (ref 3.5–5.1)
Sodium: 139 mEq/L (ref 135–145)

## 2013-01-04 LAB — SURGICAL PCR SCREEN: MRSA, PCR: NEGATIVE

## 2013-01-04 NOTE — Pre-Procedure Instructions (Signed)
Courtney David  01/04/2013   Your procedure is scheduled on:  Wed, Oct 1 @ 11:20 AM  Report to [facility]  2 * 3 at 8:15 AM.  Call this number if you have problems the morning of surgery: [phone]   Remember:   Do not eat food or drink liquids after midnight.   Take these medicines the morning of surgery with A SIP OF WATER: Catapres(Clonidine),Omeprazole(Prilosec),Effexor(Venlafaxine),and Pain Pill(if needed)               Stop taking your Ibuprofen and Flexeril.No Goody's,BC's,Aleve,Aspirin,Fish Oil,or any Herbal Medications   Do not wear jewelry, make-up or nail polish.  Do not wear lotions, powders, or perfumes. You may wear deodorant.  Do not shave 48 hours prior to surgery.   Do not bring valuables to the hospital.  [facility] is not responsible                  for any belongings or valuables.               Contacts, dentures or bridgework may not be worn into surgery.  Leave suitcase in the car. After surgery it may be brought to your room.  For patients admitted to the hospital, discharge time is determined by your                treatment team.               Patients discharged the day of surgery will not be allowed to drive  home.    Special Instructions: Shower using CHG 2 nights before surgery and the night before surgery.  If you shower the day of surgery use CHG.  Use special wash - you have one bottle of CHG for all showers.  You should use approximately 1/3 of the bottle for each shower.   Please read over the following fact sheets that you were given: Pain Booklet, Coughing and Deep Breathing, MRSA Information and Surgical Site Infection Prevention

## 2013-01-04 NOTE — Progress Notes (Signed)
Pt doesn't have a cardiologist  Denies ever having an echo/stress test/heart cath  Dr.Daniel Eloise Harman is Medical Md  CXR in epic from 12-07-12

## 2013-01-04 NOTE — Progress Notes (Signed)
Anesthesia PAT Evaluation:  Patient is a 52 year old female scheduled for C3-4, C4-5, C6-7 ACDF on 01/09/13 by Dr. Jeral Fruit.  She recently developed LUE pain/weakness and CT showed loosening of C5-6 ACDF hardware with spinal stenosis at levels C4-7 and spinal cord mass edfect at C4-5.  She has been wearing a soft c-collar.    Other history includes smoking, HLD, HTN, GERD, migraines (with vertigo-type aura), gout, arthritis, LLE leg injury ~ 4 years ago now with RSD effecting her LLE and also her left side in general and right hand.  PCP is listed as Dr. Jarome Matin.    She has been treated by numerous anesthesia and pain specialists (Dr. Isaiah Serge, Dr. Barnie Alderman, and most recently Dr. Barrie Dunker at [facility]) in the past three years with multiple procedures including spinal cord stimulator, epidural injection, ketamine infusions.  She feels that she has had increasing difficulty with short term memory issues over this time frame. She has not been evaluated by a neurologist. During her last procedure done under twilight, it took her an usually long time to fully awaken.  She does express some concern about how anesthesia may affect her memory further.  In addition, her grandmother developed frontal lobe dementia following surgery for a hip fracture at age 51 years old (> 15 years ago).  She did not have any significant dementia prior to this.  Her grandmother ultimately died at age 33.  Patient also has problems with post-operative N/V.  EKG on 01/04/13 showed NSR, non-specific ST abnormality.  CXR on 12/07/12 showed no active cardiopulmonary disease.  Preoperative labs noted.  I spoke with patient regarding her anesthesia concerns during her PAT visit.  She understands that there is a small risk of memory deficits post-operatively, usually temporary.  She is not of advanced age, does not drink ETOH, and does not have a history of CVA which will lessen her likelihood. Anticipate use of BIS  monitoring. Consider shorter acting agents. Her anesthesiologist will evaluate her further on the day of surgery to discuss the definitive plan. I updated anesthesiologist Dr. Chaney Malling of patient's anesthesia concerns.  Velna Ochs [facility] Short Stay Center/Anesthesiology Phone [phone] 01/04/2013 4:24 PM

## 2013-01-04 NOTE — Anesthesia Preprocedure Evaluation (Addendum)
Anesthesia Evaluation  Patient identified by MRN, date of birth, ID band Patient awake    Reviewed: Allergy & Precautions, H&P , NPO status , Patient's Chart, lab work & pertinent test results  History of Anesthesia Complications (+) PONV  Airway Mallampati: II  Neck ROM: full    Dental  (+) Teeth Intact and Dental Advisory Given   Pulmonary Current Smoker,          Cardiovascular hypertension, Rhythm:Regular Rate:Normal     Neuro/Psych +RSD  Neuromuscular disease    GI/Hepatic GERD-  ,  Endo/Other    Renal/GU      Musculoskeletal  (+) Arthritis -,   Abdominal   Peds  Hematology   Anesthesia Other Findings   Reproductive/Obstetrics                          Anesthesia Physical Anesthesia Plan  ASA: III  Anesthesia Plan: General   Post-op Pain Management:    Induction: Intravenous  Airway Management Planned: Oral ETT  Additional Equipment:   Intra-op Plan:   Post-operative Plan: Extubation in OR  Informed Consent: I have reviewed the patients History and Physical, chart, labs and discussed the procedure including the risks, benefits and alternatives for the proposed anesthesia with the patient or authorized representative who has indicated his/her understanding and acceptance.     Plan Discussed with: CRNA, Anesthesiologist and Surgeon  Anesthesia Plan Comments: (See my anesthesia note--patient with concerns about memory loss with anesthesia.  Shonna Chock, PA-C)       Anesthesia Quick Evaluation

## 2013-01-08 MED ORDER — CEFAZOLIN SODIUM-DEXTROSE 2-3 GM-% IV SOLR
2.0000 g | INTRAVENOUS | Status: AC
  Administered 2013-01-09: 2 g via INTRAVENOUS
  Filled 2013-01-08: qty 50

## 2013-01-09 ENCOUNTER — Inpatient Hospital Stay (HOSPITAL_COMMUNITY)
Admission: RE | Admit: 2013-01-09 | Discharge: 2013-01-12 | DRG: 865 | Disposition: A | Discharge status: Home Health | Payer: BC Managed Care – PPO | Source: Ambulatory Visit | Attending: Neurosurgery | Admitting: Neurosurgery

## 2013-01-09 ENCOUNTER — Ambulatory Visit (HOSPITAL_COMMUNITY): Payer: BC Managed Care – PPO | Admitting: Certified Registered"

## 2013-01-09 ENCOUNTER — Encounter (HOSPITAL_COMMUNITY)
Admission: RE | Disposition: A | Discharge status: Home Health | Payer: Self-pay | Source: Ambulatory Visit | Attending: Neurosurgery

## 2013-01-09 ENCOUNTER — Encounter (HOSPITAL_COMMUNITY): Payer: Self-pay | Admitting: Vascular Surgery

## 2013-01-09 ENCOUNTER — Encounter (HOSPITAL_COMMUNITY): Payer: Self-pay | Admitting: *Deleted

## 2013-01-09 ENCOUNTER — Ambulatory Visit (HOSPITAL_COMMUNITY): Payer: BC Managed Care – PPO

## 2013-01-09 DIAGNOSIS — K5900 Constipation, unspecified: Secondary | ICD-10-CM | POA: Diagnosis present

## 2013-01-09 DIAGNOSIS — Q762 Congenital spondylolisthesis: Principal | ICD-10-CM

## 2013-01-09 DIAGNOSIS — Z472 Encounter for removal of internal fixation device: Secondary | ICD-10-CM

## 2013-01-09 DIAGNOSIS — Z79899 Other long term (current) drug therapy: Secondary | ICD-10-CM

## 2013-01-09 DIAGNOSIS — Z23 Encounter for immunization: Secondary | ICD-10-CM

## 2013-01-09 DIAGNOSIS — I10 Essential (primary) hypertension: Secondary | ICD-10-CM | POA: Diagnosis present

## 2013-01-09 DIAGNOSIS — M4802 Spinal stenosis, cervical region: Secondary | ICD-10-CM | POA: Diagnosis present

## 2013-01-09 DIAGNOSIS — G43909 Migraine, unspecified, not intractable, without status migrainosus: Secondary | ICD-10-CM | POA: Diagnosis present

## 2013-01-09 DIAGNOSIS — G9050 Complex regional pain syndrome I, unspecified: Secondary | ICD-10-CM | POA: Diagnosis present

## 2013-01-09 DIAGNOSIS — F17200 Nicotine dependence, unspecified, uncomplicated: Secondary | ICD-10-CM | POA: Diagnosis present

## 2013-01-09 DIAGNOSIS — E785 Hyperlipidemia, unspecified: Secondary | ICD-10-CM | POA: Diagnosis present

## 2013-01-09 DIAGNOSIS — K219 Gastro-esophageal reflux disease without esophagitis: Secondary | ICD-10-CM | POA: Diagnosis present

## 2013-01-09 DIAGNOSIS — Z981 Arthrodesis status: Secondary | ICD-10-CM

## 2013-01-09 HISTORY — PX: ANTERIOR CERVICAL DECOMP/DISCECTOMY FUSION: SHX1161

## 2013-01-09 SURGERY — ANTERIOR CERVICAL DECOMPRESSION/DISCECTOMY FUSION 3 LEVELS
Anesthesia: General | Wound class: Clean

## 2013-01-09 MED ORDER — 0.9 % SODIUM CHLORIDE (POUR BTL) OPTIME
TOPICAL | Status: DC | PRN
  Administered 2013-01-09: 1000 mL

## 2013-01-09 MED ORDER — CEFAZOLIN SODIUM 1-5 GM-% IV SOLN
1.0000 g | Freq: Three times a day (TID) | INTRAVENOUS | Status: AC
Start: 2013-01-09 — End: 2013-01-10
  Administered 2013-01-09 – 2013-01-10 (×2): 1 g via INTRAVENOUS
  Filled 2013-01-09 (×2): qty 50

## 2013-01-09 MED ORDER — FENTANYL CITRATE 0.05 MG/ML IJ SOLN
INTRAMUSCULAR | Status: DC | PRN
  Administered 2013-01-09: 50 ug via INTRAVENOUS
  Administered 2013-01-09: 100 ug via INTRAVENOUS
  Administered 2013-01-09 (×3): 50 ug via INTRAVENOUS
  Administered 2013-01-09: 100 ug via INTRAVENOUS

## 2013-01-09 MED ORDER — GLYCOPYRROLATE 0.2 MG/ML IJ SOLN
INTRAMUSCULAR | Status: DC | PRN
  Administered 2013-01-09: 0.4 mg via INTRAVENOUS

## 2013-01-09 MED ORDER — TOPIRAMATE 25 MG PO TABS
50.0000 mg | ORAL_TABLET | Freq: Every evening | ORAL | Status: DC
  Administered 2013-01-09 – 2013-01-11 (×3): 50 mg via ORAL
  Filled 2013-01-09 (×4): qty 2

## 2013-01-09 MED ORDER — PROPOFOL 10 MG/ML IV BOLUS
INTRAVENOUS | Status: DC | PRN
  Administered 2013-01-09: 170 mg via INTRAVENOUS

## 2013-01-09 MED ORDER — SODIUM CHLORIDE 0.9 % IJ SOLN
3.0000 mL | Freq: Two times a day (BID) | INTRAMUSCULAR | Status: DC
  Administered 2013-01-09 – 2013-01-12 (×4): 3 mL via INTRAVENOUS

## 2013-01-09 MED ORDER — MORPHINE SULFATE 2 MG/ML IJ SOLN
1.0000 mg | INTRAMUSCULAR | Status: DC | PRN
Start: 2013-01-09 — End: 2013-01-12
  Administered 2013-01-09 – 2013-01-10 (×3): 2 mg via INTRAVENOUS
  Administered 2013-01-10: 4 mg via INTRAVENOUS
  Administered 2013-01-10 (×2): 2 mg via INTRAVENOUS
  Administered 2013-01-10: 4 mg via INTRAVENOUS
  Administered 2013-01-10 (×2): 2 mg via INTRAVENOUS
  Administered 2013-01-11: 4 mg via INTRAVENOUS
  Filled 2013-01-09: qty 2
  Filled 2013-01-09: qty 1
  Filled 2013-01-09: qty 2
  Filled 2013-01-09: qty 1
  Filled 2013-01-09: qty 2
  Filled 2013-01-09: qty 1
  Filled 2013-01-09: qty 2
  Filled 2013-01-09: qty 1
  Filled 2013-01-09: qty 2
  Filled 2013-01-09 (×2): qty 1

## 2013-01-09 MED ORDER — INFLUENZA VAC SPLIT QUAD 0.5 ML IM SUSP
0.5000 mL | INTRAMUSCULAR | Status: AC
  Administered 2013-01-10: 0.5 mL via INTRAMUSCULAR
  Filled 2013-01-09: qty 0.5

## 2013-01-09 MED ORDER — DEXAMETHASONE 4 MG PO TABS
4.0000 mg | ORAL_TABLET | Freq: Four times a day (QID) | ORAL | Status: DC
  Administered 2013-01-09 – 2013-01-12 (×12): 4 mg via ORAL
  Filled 2013-01-09 (×15): qty 1

## 2013-01-09 MED ORDER — TAPENTADOL HCL ER 50 MG PO TB12: 50.0000 mg | ORAL_TABLET | Freq: Four times a day (QID) | ORAL | Status: DC | PRN

## 2013-01-09 MED ORDER — ARTIFICIAL TEARS OP OINT
TOPICAL_OINTMENT | OPHTHALMIC | Status: DC | PRN
  Administered 2013-01-09: 1 via OPHTHALMIC

## 2013-01-09 MED ORDER — ACETAMINOPHEN 650 MG RE SUPP: 650.0000 mg | RECTAL | Status: DC | PRN

## 2013-01-09 MED ORDER — ONDANSETRON HCL 4 MG/2ML IJ SOLN
INTRAMUSCULAR | Status: DC | PRN
  Administered 2013-01-09: 4 mg via INTRAVENOUS

## 2013-01-09 MED ORDER — TOPIRAMATE 25 MG PO TABS
25.0000 mg | ORAL_TABLET | Freq: Every morning | ORAL | Status: DC
  Administered 2013-01-10 – 2013-01-12 (×3): 25 mg via ORAL
  Filled 2013-01-09 (×3): qty 1

## 2013-01-09 MED ORDER — ATORVASTATIN CALCIUM 40 MG PO TABS
40.0000 mg | ORAL_TABLET | Freq: Every evening | ORAL | Status: DC
  Administered 2013-01-09 – 2013-01-11 (×3): 40 mg via ORAL
  Filled 2013-01-09 (×5): qty 1

## 2013-01-09 MED ORDER — DEXAMETHASONE SODIUM PHOSPHATE 4 MG/ML IJ SOLN
INTRAMUSCULAR | Status: DC | PRN
  Administered 2013-01-09: 8 mg via INTRAVENOUS

## 2013-01-09 MED ORDER — TOPIRAMATE 25 MG PO TABS: 25.0000 mg | ORAL_TABLET | Freq: Two times a day (BID) | ORAL | Status: DC

## 2013-01-09 MED ORDER — LACTATED RINGERS IV SOLN
INTRAVENOUS | Status: DC
  Administered 2013-01-09 (×2): via INTRAVENOUS

## 2013-01-09 MED ORDER — DEXAMETHASONE SODIUM PHOSPHATE 4 MG/ML IJ SOLN
4.0000 mg | Freq: Four times a day (QID) | INTRAMUSCULAR | Status: DC
  Filled 2013-01-09 (×12): qty 1

## 2013-01-09 MED ORDER — SODIUM CHLORIDE 0.9 % IJ SOLN: 3.0000 mL | INTRAMUSCULAR | Status: DC | PRN

## 2013-01-09 MED ORDER — SENNA 8.6 MG PO TABS
1.0000 | ORAL_TABLET | Freq: Two times a day (BID) | ORAL | Status: DC
  Administered 2013-01-09 – 2013-01-12 (×6): 8.6 mg via ORAL
  Filled 2013-01-09 (×7): qty 1

## 2013-01-09 MED ORDER — HEMOSTATIC AGENTS (NO CHARGE) OPTIME
TOPICAL | Status: DC | PRN
  Administered 2013-01-09: 1 via TOPICAL

## 2013-01-09 MED ORDER — MENTHOL 3 MG MT LOZG
1.0000 | LOZENGE | OROMUCOSAL | Status: DC | PRN
  Filled 2013-01-09: qty 9

## 2013-01-09 MED ORDER — ACETAMINOPHEN 325 MG PO TABS: 650.0000 mg | ORAL_TABLET | ORAL | Status: DC | PRN

## 2013-01-09 MED ORDER — HYDROMORPHONE HCL PF 1 MG/ML IJ SOLN
INTRAMUSCULAR | Status: AC
  Filled 2013-01-09: qty 1

## 2013-01-09 MED ORDER — PANTOPRAZOLE SODIUM 40 MG PO TBEC
40.0000 mg | DELAYED_RELEASE_TABLET | Freq: Every day | ORAL | Status: DC
  Administered 2013-01-10 – 2013-01-12 (×3): 40 mg via ORAL
  Filled 2013-01-09 (×3): qty 1

## 2013-01-09 MED ORDER — SURGIFOAM 100 EX MISC
CUTANEOUS | Status: DC | PRN
  Administered 2013-01-09: 11:00:00 via TOPICAL

## 2013-01-09 MED ORDER — PHENYLEPHRINE HCL 10 MG/ML IJ SOLN
INTRAMUSCULAR | Status: DC | PRN
  Administered 2013-01-09 (×4): 80 ug via INTRAVENOUS

## 2013-01-09 MED ORDER — LIDOCAINE HCL (CARDIAC) 20 MG/ML IV SOLN
INTRAVENOUS | Status: DC | PRN
  Administered 2013-01-09: 80 mg via INTRAVENOUS

## 2013-01-09 MED ORDER — NEOSTIGMINE METHYLSULFATE 1 MG/ML IJ SOLN
INTRAMUSCULAR | Status: DC | PRN
  Administered 2013-01-09: 3 mg via INTRAVENOUS

## 2013-01-09 MED ORDER — PHENOL 1.4 % MT LIQD
1.0000 | OROMUCOSAL | Status: DC | PRN
  Filled 2013-01-09: qty 177

## 2013-01-09 MED ORDER — CLONIDINE HCL 0.1 MG PO TABS
0.0500 mg | ORAL_TABLET | Freq: Two times a day (BID) | ORAL | Status: DC
  Administered 2013-01-09 – 2013-01-12 (×6): 0.05 mg via ORAL
  Filled 2013-01-09 (×7): qty 0.5

## 2013-01-09 MED ORDER — OXYCODONE HCL 5 MG PO TABS
10.0000 mg | ORAL_TABLET | Freq: Two times a day (BID) | ORAL | Status: DC | PRN
  Administered 2013-01-09 – 2013-01-11 (×3): 10 mg via ORAL
  Filled 2013-01-09 (×5): qty 2

## 2013-01-09 MED ORDER — DIAZEPAM 5 MG PO TABS
ORAL_TABLET | ORAL | Status: AC
  Filled 2013-01-09: qty 1

## 2013-01-09 MED ORDER — ONDANSETRON HCL 4 MG/2ML IJ SOLN: 4.0000 mg | INTRAMUSCULAR | Status: DC | PRN

## 2013-01-09 MED ORDER — ROCURONIUM BROMIDE 100 MG/10ML IV SOLN
INTRAVENOUS | Status: DC | PRN
  Administered 2013-01-09 (×2): 10 mg via INTRAVENOUS
  Administered 2013-01-09: 50 mg via INTRAVENOUS

## 2013-01-09 MED ORDER — VENLAFAXINE HCL ER 75 MG PO CP24
75.0000 mg | ORAL_CAPSULE | Freq: Every day | ORAL | Status: DC
  Administered 2013-01-10 – 2013-01-12 (×3): 75 mg via ORAL
  Filled 2013-01-09 (×3): qty 1

## 2013-01-09 MED ORDER — VITAMIN D (ERGOCALCIFEROL) 1.25 MG (50000 UNIT) PO CAPS
50000.0000 [IU] | ORAL_CAPSULE | ORAL | Status: DC
  Administered 2013-01-10: 50000 [IU] via ORAL
  Filled 2013-01-09: qty 1

## 2013-01-09 MED ORDER — SODIUM CHLORIDE 0.9 % IV SOLN: 250.0000 mL | INTRAVENOUS | Status: DC

## 2013-01-09 MED ORDER — HYDROMORPHONE HCL PF 1 MG/ML IJ SOLN
0.2500 mg | INTRAMUSCULAR | Status: DC | PRN
  Administered 2013-01-09 (×4): 0.5 mg via INTRAVENOUS

## 2013-01-09 MED ORDER — ONDANSETRON HCL 4 MG/2ML IJ SOLN: 4.0000 mg | Freq: Four times a day (QID) | INTRAMUSCULAR | Status: DC | PRN

## 2013-01-09 MED ORDER — ZOLPIDEM TARTRATE 5 MG PO TABS: 5.0000 mg | ORAL_TABLET | Freq: Every evening | ORAL | Status: DC | PRN

## 2013-01-09 MED ORDER — DIAZEPAM 5 MG PO TABS
5.0000 mg | ORAL_TABLET | Freq: Four times a day (QID) | ORAL | Status: DC | PRN
  Administered 2013-01-09 – 2013-01-11 (×2): 5 mg via ORAL
  Filled 2013-01-09 (×3): qty 1

## 2013-01-09 MED ORDER — SODIUM CHLORIDE 0.9 % IV SOLN
INTRAVENOUS | Status: DC
  Administered 2013-01-10 (×2): via INTRAVENOUS

## 2013-01-09 MED ORDER — THROMBIN 5000 UNITS EX SOLR
CUTANEOUS | Status: DC | PRN
  Administered 2013-01-09 (×2): 5000 [IU] via TOPICAL

## 2013-01-09 SURGICAL SUPPLY — 75 items
APL SKNCLS STERI-STRIP NONHPOA (GAUZE/BANDAGES/DRESSINGS) ×1
BANDAGE GAUZE ELAST BULKY 4 IN (GAUZE/BANDAGES/DRESSINGS) ×4 IMPLANT
BENZOIN TINCTURE PRP APPL 2/3 (GAUZE/BANDAGES/DRESSINGS) ×2 IMPLANT
BLADE ULTRA TIP 2M (BLADE) ×2 IMPLANT
BUR BARREL STRAIGHT FLUTE 4.0 (BURR) IMPLANT
BUR MATCHSTICK NEURO 3.0 LAGG (BURR) ×2 IMPLANT
CANISTER SUCTION 2500CC (MISCELLANEOUS) ×2 IMPLANT
CLOTH BEACON ORANGE TIMEOUT ST (SAFETY) ×2 IMPLANT
CONT SPEC 4OZ CLIKSEAL STRL BL (MISCELLANEOUS) ×2 IMPLANT
COVER MAYO STAND STRL (DRAPES) ×2 IMPLANT
DRAIN JACKSON PRATT 10MM FLAT (MISCELLANEOUS) ×1 IMPLANT
DRAPE C-ARM 42X72 X-RAY (DRAPES) ×4 IMPLANT
DRAPE LAPAROTOMY 100X72 PEDS (DRAPES) ×2 IMPLANT
DRAPE MICROSCOPE LEICA (MISCELLANEOUS) ×2 IMPLANT
DRAPE POUCH INSTRU U-SHP 10X18 (DRAPES) ×2 IMPLANT
DRAPE PROXIMA HALF (DRAPES) ×1 IMPLANT
DURAPREP 6ML APPLICATOR 50/CS (WOUND CARE) ×2 IMPLANT
ELECT REM PT RETURN 9FT ADLT (ELECTROSURGICAL) ×2
ELECTRODE REM PT RTRN 9FT ADLT (ELECTROSURGICAL) ×1 IMPLANT
EVACUATOR SILICONE 100CC (DRAIN) ×1 IMPLANT
GAUZE SPONGE 4X4 16PLY XRAY LF (GAUZE/BANDAGES/DRESSINGS) IMPLANT
GLOVE BIO SURGEON STRL SZ 6.5 (GLOVE) IMPLANT
GLOVE BIO SURGEON STRL SZ7 (GLOVE) IMPLANT
GLOVE BIO SURGEON STRL SZ7.5 (GLOVE) IMPLANT
GLOVE BIO SURGEON STRL SZ8 (GLOVE) IMPLANT
GLOVE BIO SURGEON STRL SZ8.5 (GLOVE) ×1 IMPLANT
GLOVE BIOGEL M 8.0 STRL (GLOVE) ×2 IMPLANT
GLOVE ECLIPSE 6.5 STRL STRAW (GLOVE) IMPLANT
GLOVE ECLIPSE 7.0 STRL STRAW (GLOVE) IMPLANT
GLOVE ECLIPSE 7.5 STRL STRAW (GLOVE) ×5 IMPLANT
GLOVE ECLIPSE 8.0 STRL XLNG CF (GLOVE) IMPLANT
GLOVE ECLIPSE 8.5 STRL (GLOVE) IMPLANT
GLOVE EXAM NITRILE LRG STRL (GLOVE) ×2 IMPLANT
GLOVE EXAM NITRILE MD LF STRL (GLOVE) IMPLANT
GLOVE EXAM NITRILE XL STR (GLOVE) IMPLANT
GLOVE EXAM NITRILE XS STR PU (GLOVE) IMPLANT
GLOVE INDICATOR 6.5 STRL GRN (GLOVE) IMPLANT
GLOVE INDICATOR 7.0 STRL GRN (GLOVE) IMPLANT
GLOVE INDICATOR 7.5 STRL GRN (GLOVE) ×2 IMPLANT
GLOVE INDICATOR 8.0 STRL GRN (GLOVE) IMPLANT
GLOVE INDICATOR 8.5 STRL (GLOVE) IMPLANT
GLOVE OPTIFIT SS 8.0 STRL (GLOVE) ×1 IMPLANT
GLOVE OPTIFIT SS 8.5 STRL (GLOVE) ×1 IMPLANT
GLOVE SURG SS PI 6.5 STRL IVOR (GLOVE) IMPLANT
GOWN BRE IMP SLV AUR LG STRL (GOWN DISPOSABLE) ×2 IMPLANT
GOWN BRE IMP SLV AUR XL STRL (GOWN DISPOSABLE) IMPLANT
GOWN STRL REIN 2XL LVL4 (GOWN DISPOSABLE) IMPLANT
HEAD HALTER (SOFTGOODS) ×2 IMPLANT
HEMOSTAT POWDER KIT SURGIFOAM (HEMOSTASIS) IMPLANT
HEMOSTAT POWDER SURGIFOAM 1G (HEMOSTASIS) ×2 IMPLANT
KIT BASIN OR (CUSTOM PROCEDURE TRAY) ×2 IMPLANT
KIT ROOM TURNOVER OR (KITS) ×2 IMPLANT
NDL SPNL 22GX3.5 QUINCKE BK (NEEDLE) ×1 IMPLANT
NEEDLE SPNL 22GX3.5 QUINCKE BK (NEEDLE) ×2 IMPLANT
NS IRRIG 1000ML POUR BTL (IV SOLUTION) ×2 IMPLANT
PACK LAMINECTOMY NEURO (CUSTOM PROCEDURE TRAY) ×2 IMPLANT
PATTIES SURGICAL .5 X1 (DISPOSABLE) ×2 IMPLANT
PLATE ANT CERV XTEND 4 LV 69 (Plate) ×1 IMPLANT
PUTTY DBX 1CC (Putty) ×2 IMPLANT
PUTTY DBX 1CC DEPUY (Putty) IMPLANT
RUBBERBAND STERILE (MISCELLANEOUS) ×4 IMPLANT
SCREW SELF TAP VARIABLE 4.6X12 (Screw) ×1 IMPLANT
SCREW XTD VAR 4.2 SELF TAP 12 (Screw) ×10 IMPLANT
SPACER ACDF SM LORDOTIC 7 (Spacer) ×2 IMPLANT
SPACER COLONIAL SZ 6-7 (Spacer) ×1 IMPLANT
SPONGE GAUZE 4X4 12PLY (GAUZE/BANDAGES/DRESSINGS) ×2 IMPLANT
SPONGE INTESTINAL PEANUT (DISPOSABLE) ×4 IMPLANT
SPONGE SURGIFOAM ABS GEL 100 (HEMOSTASIS) ×2 IMPLANT
STRIP CLOSURE SKIN 1/2X4 (GAUZE/BANDAGES/DRESSINGS) ×2 IMPLANT
SUT VIC AB 3-0 SH 8-18 (SUTURE) ×3 IMPLANT
SYR 20ML ECCENTRIC (SYRINGE) ×2 IMPLANT
TOWEL OR 17X24 6PK STRL BLUE (TOWEL DISPOSABLE) ×2 IMPLANT
TOWEL OR 17X26 10 PK STRL BLUE (TOWEL DISPOSABLE) ×2 IMPLANT
TRAY FOLEY CATH 14FRSI W/METER (CATHETERS) ×1 IMPLANT
WATER STERILE IRR 1000ML POUR (IV SOLUTION) ×2 IMPLANT

## 2013-01-09 NOTE — Anesthesia Procedure Notes (Signed)
Procedure Name: Intubation Date/Time: 01/09/2013 11:30 AM Performed by: Whitman Hero Pre-anesthesia Checklist: Patient identified, Timeout performed, Emergency Drugs available and Suction available Patient Re-evaluated:Patient Re-evaluated prior to inductionOxygen Delivery Method: Circle system utilized Preoxygenation: Pre-oxygenation with 100% oxygen Intubation Type: IV induction Laryngoscope Size: Mac and 3 Grade View: Grade I Tube type: Oral Tube size: 7.0 mm Number of attempts: 1 Airway Equipment and Method: Video-laryngoscopy and Stylet Placement Confirmation: ETT inserted through vocal cords under direct vision,  breath sounds checked- equal and bilateral and positive ETCO2 Secured at: 23 cm Tube secured with: Tape Dental Injury: Teeth and Oropharynx as per pre-operative assessment  Difficulty Due To: Difficult Airway-  due to neck instability and Difficulty was anticipated

## 2013-01-09 NOTE — Progress Notes (Signed)
Op note [phone]

## 2013-01-09 NOTE — Transfer of Care (Signed)
Immediate Anesthesia Transfer of Care Note  Patient: Courtney David  Procedure(s) Performed: Procedure(s): ANTERIOR CERVICAL DECOMPRESSION/DISCECTOMY FUSION CERVICALTHREE-FOUR FOUR-FIVE,FIVE-SIX  REMOVAL OLD PLATE CERVICAL FIVE-SIX PLATING CERVICAL THREE TO CERVICAL SIX- SEVEN (N/A)  Patient Location: PACU  Anesthesia Type:General  Level of Consciousness: awake and oriented  Airway & Oxygen Therapy: Patient Spontanous Breathing and Patient connected to nasal cannula oxygen  Post-op Assessment: Report given to PACU RN  Post vital signs: Reviewed and stable  Complications: No apparent anesthesia complications

## 2013-01-09 NOTE — Preoperative (Signed)
Beta Blockers   Reason not to administer Beta Blockers:Not Applicable 

## 2013-01-09 NOTE — Anesthesia Postprocedure Evaluation (Signed)
Anesthesia Post Note  Patient: Courtney David  Procedure(s) Performed: Procedure(s) (LRB): ANTERIOR CERVICAL DECOMPRESSION/DISCECTOMY FUSION CERVICALTHREE-FOUR FOUR-FIVE,FIVE-SIX  REMOVAL OLD PLATE CERVICAL FIVE-SIX PLATING CERVICAL THREE TO CERVICAL SIX- SEVEN (N/A)  Anesthesia type: General  Patient location: PACU  Post pain: Pain level controlled and Adequate analgesia  Post assessment: Post-op Vital signs reviewed, Patient's Cardiovascular Status Stable, Respiratory Function Stable, Patent Airway and Pain level controlled  Last Vitals:  Filed Vitals:   01/09/13 1428  BP: 136/80  Pulse: 83  Temp: 37 C  Resp: 16    Post vital signs: Reviewed and stable  Level of consciousness: awake, alert  and oriented  Complications: No apparent anesthesia complications

## 2013-01-09 NOTE — Progress Notes (Signed)
Orthopedic Tech Progress Note Patient Details:  Courtney David March 05, 1961 [id number]  Ortho Devices Type of Ortho Device: Soft collar Ortho Device/Splint Location: neck Ortho Device/Splint Interventions: Ordered;Application   Jennye Moccasin 01/09/2013, 6:09 PM

## 2013-01-09 NOTE — H&P (Signed)
Courtney David is an 52 y.o. female.   Chief Complaint: neck pain HPI: patient seen with pain and weakness in the left armwith sensory changes, some pain in the right associated withdifficulties walking. About 10s ago she had an anterior fusion at 10. 3 years ago she had a diagnosis of RSD and she has been seen by the pain clinic, she has a cervical spinal cord stimulator. She was admitted to [facility] hospital to r/o MI, BECAUSE OF SUDDEN WEAKNESS OF LEFT ARM. ACCORDING  To her the left arm is useless.   Past Medical History  Diagnosis Date  . RSD (reflex sympathetic dystrophy)     mainly left side/ right hand  . Hypertension     takes Clonidine daily  . Hyperlipidemia     takes Lipitor daily  . Constipation     takes Senokot daily  . Muscle spasms of head and/or neck     takes Flexeril daily prn  . GERD (gastroesophageal reflux disease)     takes Omeprazole daily  . History of migraine     takes Topamax daily  . PONV (postoperative nausea and vomiting)   . RSD (reflex sympathetic dystrophy)   . Short-term memory loss     takes Effexor daily  . Family history of anesthesia complication     pts grandma frontal lobe died bc of anesthesia  . Vertigo   . Numbness and tingling     left arm  . Arthritis   . Joint pain   . Joint swelling   . History of gout   . Back pain   . Urinary frequency     Past Surgical History  Procedure Laterality Date  . Tmj arthroplasty    . Abdominal hysterectomy    . Ovary removed    . Neck fusion    . Shoulder surgery      screw right shoulder  . Wrist surgery      cyst removed from both wrists  . Spinal cord stimulator implant    . Knee arthroscopy      bil  . Nose surgery      d/t broke nose  . Colonoscopy      Family History  Problem Relation Age of Onset  . Colon polyps Paternal Grandfather   . Heart disease Mother   . Diabetes Mother    Social History:  reports that she has been smoking Cigarettes.  She has a 30 pack-year smoking  history. She has never used smokeless tobacco. She reports that she does not drink alcohol or use illicit drugs.  Allergies:  Allergies  Allergen Reactions  . Codeine Nausea And Vomiting    Nausea, vomiting  . Sulfa Antibiotics     Hives, itching    Medications Prior to Admission  Medication Sig Dispense Refill  . atorvastatin (LIPITOR) 40 MG tablet Take 40 mg by mouth daily.      . cloNIDine (CATAPRES) 0.1 MG tablet Take 0.05 mg by mouth 2 (two) times daily.       . cyclobenzaprine (FLEXERIL) 5 MG tablet Take 5 mg by mouth 3 (three) times daily as needed for muscle spasms.      [name] ibuprofen (ADVIL,MOTRIN) 200 MG tablet Take 800 mg by mouth every 6 (six) hours as needed for pain.      [name] omeprazole (PRILOSEC) 40 MG capsule Take 40 mg by mouth daily.        [name] senna (SENOKOT) 8.6 MG TABS tablet Take 1 tablet  by mouth 2 (two) times daily.      . Tapentadol HCl (NUCYNTA ER) 50 MG TB12 Take 50 mg by mouth every 6 (six) hours as needed (for pain).       . topiramate (TOPAMAX) 25 MG tablet Take 25-50 mg by mouth 2 (two) times daily. Pt takes 1 tablet in the morning and 2 tablets at night      . venlafaxine (EFFEXOR-XR) 75 MG 24 hr capsule Take 75 mg by mouth daily.        . Vitamin D, Ergocalciferol, (DRISDOL) 50000 UNITS CAPS Take 50,000 Units by mouth once a week.        No results found for this or any previous visit (from the past 48 hour(s)). No results found.  Review of Systems  Constitutional: Negative.   HENT: Positive for neck pain.   Eyes: Negative.   Respiratory: Negative.   Cardiovascular: Negative.   Gastrointestinal: Negative.   Skin: Negative.   Neurological: Positive for sensory change and focal weakness.  Endo/Heme/Allergies: Negative.   Psychiatric/Behavioral: Negative.     Blood pressure 130/89, pulse 82, temperature 97.8 F (36.6 C), temperature source Oral, resp. rate 20, SpO2 97.00%. Physical Exam hent, nl. Neck, anterior scar . Minimal apin.cv, nl. Lungs,  clear. Abdomen, soft. Extremities nl.NEURO strength 0biceps, 0 deltoids, 0 triceps, 0 hand grip. Right arm shows weakness of deltoids and biceps. Sensory, changes at c5,6 7 dermatomes. DTR none in left arm. Gait, walks with crutches. Myelo shows fusion at c56, stenosis at c34,45 67. Cord stimulator is at c56 givwn some stenosis but epidural space is present, sponsdylolisthesis at c34  Assessment/Plan Decompression and fusion at c34,45, 67. She is aware of risks and benefTS speciALly that it will not improve her RSD  Sachiko Methot M 01/09/2013, 10:43 AM

## 2013-01-10 MED ORDER — MENTHOL 3 MG MT LOZG: 1.0000 | LOZENGE | OROMUCOSAL | Status: DC | PRN

## 2013-01-10 MED ORDER — CYCLOBENZAPRINE HCL 10 MG PO TABS
5.0000 mg | ORAL_TABLET | Freq: Three times a day (TID) | ORAL | Status: DC | PRN
  Administered 2013-01-10 – 2013-01-12 (×5): 5 mg via ORAL
  Filled 2013-01-10 (×5): qty 1

## 2013-01-10 MED ORDER — OXYCODONE-ACETAMINOPHEN 5-325 MG PO TABS
1.0000 | ORAL_TABLET | ORAL | Status: DC | PRN
  Administered 2013-01-10 – 2013-01-12 (×10): 2 via ORAL
  Administered 2013-01-12: 1 via ORAL
  Filled 2013-01-10 (×6): qty 2
  Filled 2013-01-10: qty 1
  Filled 2013-01-10 (×4): qty 2

## 2013-01-10 NOTE — Op Note (Signed)
NAME:  ESSA, WENK NO.:  [id number]  MEDICAL RECORD NO.:  [id number]  LOCATION:  [id number]                        FACILITY:  [facility]  PHYSICIAN:  Hilda Lias, M.D.   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  C4-C5 stenosis, C6-7 stenosis.  Status post fusion 5-6.  History of reflex sympathetic dystrophy.  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  C4-C5 stenosis, C6-7 stenosis. Status post fusion 5-6. History of reflex sympathetic dystrophy.  PROCEDURE:  Removal of 5-6 plate and screws.  C3-4, 4-5, and 6-7 diskectomy, decompression of spinal cord, interbody fusion with cages, plate from W1-X9, microscope.  SURGEON:  Hilda Lias, M.D.  ASSISTANT:  Cristi Loron, M.D.  CLINICAL HISTORY:  The patient was seen in my office because of neck pain, which gets worse with mobility.  The patient has a history of previous fusion at 5-6.  She developed RSD and she had spinal cord stimulator.  Myelogram showed that she has severe stenosis at the level of 3-4, 4-5, and 6-7 with spondylosis and herniated disk.  Surgery was advised.  She was fully aware that this will not change her problem with RSD.  PROCEDURE:  The patient was taken to the OR, and after intubation, the left side of the neck was cleaned with Betadine and DuraPrep.  I used a different incision right at the level of C4-5.  The incision was transverse to the skin and subcutaneous tissue.  Dissection was carried down.  We were able only to visualize the plate at level of 5-6, but the space at the level of 6-7 and a ball at the level of C3-C4.  From then on, we proceeded with opening the anterior ligament at the level of 4-5 and 3-4 first.  With the drill and the micro curette, we did a total diskectomy.  We brought the microscope  into the area.  The patient had quite a bit of narrowing with quite a bit of spondylosis and decompression of the spinal cord as well as the C4 and C3 nerve root, those 2 levels.  Then our attention was directed to the level of C6-7, which mostly had quite a bit of degenerative disk disease with stenosis. Decompression of the spinal cord as well as the C7 nerve root was achieved.  The endplate were drilled.  Then, we proceeded with removal of the plate and 4 screws.  Then, plate from J4-N8 using screw was done. Lateral cervical spine showed good position of the plate and the cages at 3-4 and 4-5.  We were unable to see below.  The area was irrigated.  Although we achieved good hemostasis, we left a drain in the upper cervical area.  The wound was closed with Vicryl and Steri-Strips. The patient did well.  She is going to go to PACU.  Later on as an outpatient, she is going to be transferred back to the Pain Clinic for continued treatment of the RSD.          ______________________________ Hilda Lias, M.D.  EB/MEDQ  D:  01/09/2013  T:  01/10/2013  Job:  [id number]

## 2013-01-10 NOTE — Evaluation (Signed)
Occupational Therapy Evaluation Patient Details Name: Courtney David MRN: [id number] DOB: [birth year] Today's Date: 01/10/2013 Time: 4098-1191 OT Time Calculation (min): 21 min  OT Assessment / Plan / Recommendation History of present illness 52 year old female with 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   Clinical Impression    Pt requires min A with ADLs due to cervical precautions and is min guard A with ADL mobility safety. Pt lives at home with a roommate and will have help from friends prn when roommate is not at home. Pt would benefit from acute OT services to help restore PLOF to return home safe and as independent as possible             OT Assessment  Patient needs continued OT Services    Follow Up Recommendations  No OT follow up;Supervision - Intermittent    Barriers to Discharge   None  Equipment Recommendations  Tub/shower bench;Other (comment) (ADL A/E kit)    Recommendations for Other Services    Frequency  Min 2X/week    Precautions / Restrictions Precautions Precautions: Fall;Cervical Precaution Comments: reviewed cervical precautions with pt Required Braces or Orthoses: Cervical Brace Cervical Brace: Soft collar Restrictions Weight Bearing Restrictions: No   Pertinent Vitals/Pain 3/10    ADL  Upper Body Bathing: Simulated;Set up;Supervision/safety Lower Body Bathing: Simulated;Minimal assistance Upper Body Dressing: Performed;Minimal assistance Lower Body Dressing: Performed;Minimal assistance Toilet Transfer: Performed Toilet Transfer Method: Sit to stand Toilet Transfer Equipment: Regular height toilet;Grab bars Toileting - Clothing Manipulation and Hygiene: Performed;Min guard Where Assessed - Glass blower/designer Manipulation and Hygiene: Standing Tub/Shower Transfer Method: Not assessed Equipment Used: Sock aid;Long-handled shoe horn;Long-handled sponge;Reacher ADL Comments: Pt provided with education and demo of ADL A/E, UB  dressing techniques and tub bench use for home with picture provided    OT Diagnosis: Generalized weakness;Acute pain  OT Problem List: Impaired balance (sitting and/or standing);Decreased knowledge of precautions;Decreased knowledge of use of DME or AE OT Treatment Interventions: Self-care/ADL training;DME and/or AE instruction;Patient/family education;Therapeutic activities   OT Goals(Current goals can be found in the care plan section) Acute Rehab OT Goals Patient Stated Goal: return to PLOF OT Goal Formulation: With patient Time For Goal Achievement: 01/17/13 Potential to Achieve Goals: Good ADL Goals Pt Will Perform Grooming: with set-up;with supervision;standing Pt Will Perform Lower Body Bathing: with min guard assist;with supervision;with set-up;with adaptive equipment Pt Will Perform Upper Body Dressing: with min guard assist;with supervision;with set-up Pt Will Perform Lower Body Dressing: with min guard assist;with supervision;with set-up;with adaptive equipment Pt Will Transfer to Toilet: with supervision;regular height toilet;grab bars;ambulating Pt Will Perform Toileting - Clothing Manipulation and hygiene: with supervision Pt Will Perform Tub/Shower Transfer: with min guard assist;with supervision;tub bench  Visit Information  Last OT Received On: 01/10/13 Assistance Needed: +1 History of Present Illness: 52 year old female with 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       Prior Functioning     Home Living Family/patient expects to be discharged to:: Private residence Living Arrangements: Spouse/significant other Available Help at Discharge: Friend(s);Available 24 hours/day Type of Home: House Home Access: Stairs to enter [employer] of Steps: 2 Entrance Stairs-Rails: None Home Layout: Two level;1/2 bath on main level;Bed/bath upstairs Alternate Level Stairs-Number of Steps: flight and then 4 Alternate Level Stairs-Rails: Right Home  Equipment: Crutches;Wheelchair - power;Shower seat Prior Function Level of Independence: Independent with assistive device(s) Communication Communication: No difficulties Dominant Hand: Right  Vision/Perception Vision - History Baseline Vision: Wears glasses all the time Patient Visual Report: No change from baseline Perception Perception: Within Functional Limits   Cognition  Cognition Arousal/Alertness: Awake/alert Behavior During Therapy: WFL for tasks assessed/performed Overall Cognitive Status: Within Functional Limits for tasks assessed    Extremity/Trunk Assessment Upper Extremity Assessment Upper Extremity Assessment: Overall WFL for tasks assessed;Generalized weakness LUE Deficits / Details: 4-/5 grossly Lower Extremity Assessment Lower Extremity Assessment: Defer to PT evaluation Cervical / Trunk Assessment Cervical / Trunk Assessment: Normal     Mobility Bed Mobility Bed Mobility: Not assessed Rolling Left: 5: Supervision Left Sidelying to Sit: 5: Supervision;HOB elevated Details for Bed Mobility Assistance: pt up in recliner Transfers Sit to Stand: 4: Min guard;With upper extremity assist;From chair/3-in-1;From toilet Stand to Sit: 4: Min guard;With upper extremity assist;To chair/3-in-1;To toilet Details for Transfer Assistance: verbal cues for neck precautions, min/guard for safety     Exercise     Balance Balance Balance Assessed: Yes Dynamic Standing Balance Dynamic Standing - Balance Support: No upper extremity supported;During functional activity Dynamic Standing - Level of Assistance: 5: Stand by assistance;Other (comment) (min guard A)   End of Session OT - End of Session Activity Tolerance: Patient tolerated treatment well Patient left: in chair;with call bell/phone within reach  GO     Galen Manila 01/10/2013, 1:32 PM

## 2013-01-10 NOTE — Progress Notes (Signed)
Patient ID: Courtney David, female   DOB: June 24, 1960, 52 y.o.   MRN: [id number] Drain came out, strength better, voice normal

## 2013-01-10 NOTE — Progress Notes (Signed)
Pt c/o of pain and spasms  in the neck radiating to the right arm,had iv morphine 2mg  at 0038 with little effect,Dr Jenkins(on call) paged and notified,ordered to give tab percocet 1-2 tabs and flexeril 5mg  both as needed,same commenced at 0131,right arm elevated on a pillow,pt reassured,will however continue to monitor. Obasogie-Asidi, Chandler Stofer Efe

## 2013-01-10 NOTE — Evaluation (Signed)
Physical Therapy Evaluation Patient Details Name: Courtney David MRN: [id number] DOB: October 09, 1960 Today's Date: 01/10/2013 Time: 1191-4782 PT Time Calculation (min): 19 min  PT Assessment / Plan / Recommendation History of Present Illness  52 year old female with 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  Clinical Impression  Pt admitted with above. Pt currently with functional limitations due to the deficits listed below (see PT Problem List).  Pt will benefit from skilled PT to increase their independence and safety with mobility to allow discharge to the venue listed below.  Pt reports she lives with her roommate and when her roommate is at work she will have friends over to assist her.  Pt educated on safe stair technique and agreeable to have another person present when performing for safety.  Pt educated on cervical precautions and given handout.     PT Assessment  Patient needs continued PT services    Follow Up Recommendations  Home health PT;Supervision - Intermittent    Does the patient have the potential to tolerate intense rehabilitation      Barriers to Discharge        Equipment Recommendations  None recommended by PT    Recommendations for Other Services     Frequency Min 5X/week    Precautions / Restrictions Precautions Precautions: Fall;Cervical Required Braces or Orthoses: Cervical Brace Cervical Brace: Soft collar   Pertinent Vitals/Pain 8/10 neck pain, RN in to give IV meds, pt agreeable to mobilize stating "I'm tough"      Mobility  Bed Mobility Bed Mobility: Rolling Left;Left Sidelying to Sit Rolling Left: 5: Supervision Left Sidelying to Sit: 5: Supervision;HOB elevated Details for Bed Mobility Assistance: verbal cues for log roll technique, pt used one UE to support neck in soft collar for comfort Transfers Transfers: Sit to Stand;Stand to Sit Sit to Stand: 4: Min guard;With upper extremity assist;From bed Stand to Sit: 4: Min  guard;With upper extremity assist;To chair/3-in-1 Details for Transfer Assistance: verbal cues for neck precautions, min/guard for safety Ambulation/Gait Ambulation/Gait Assistance: 4: Min guard;5: Supervision Ambulation Distance (Feet): 40 Feet (x2) Assistive device: Lofstrands Ambulation/Gait Assistance Details: initially min/guard for safety however progressed to supervision, pt ambulated to/from stairs, verbal cues for maintaining cervical precautions and not looking down to floor (unless with eyes only) Gait Pattern: Step-to pattern;Decreased stance time - left;Decreased step length - right;Antalgic;Decreased dorsiflexion - left Gait velocity: Slow gait speed Stairs: Yes Stairs Assistance: 4: Min guard Stairs Assistance Details (indicate cue type and reason): performed with rail on right and loftstrand on left 2 steps forwards with verbal cues for safe sequence, pt also wished to attempt with rail only sideways however pt's rail on right and unable to tolerate up first on her L LE (painful, weaker LE per pt) so only performed one step with min assist (pt states she will attempt to have her brother put in rail on L side inside home), pt agreeable to have person present to assist when performing stairs at home for safety, verbal cues for more hip/knee flexion upon descending to keep cervical spine more straight (down with loftstrand crutch first) Number of Stairs: 2    Exercises     PT Diagnosis: Difficulty walking;Acute pain  PT Problem List: Decreased strength;Decreased mobility;Decreased knowledge of precautions;Pain PT Treatment Interventions: DME instruction;Gait training;Stair training;Functional mobility training;Therapeutic activities;Therapeutic exercise;Patient/family education     PT Goals(Current goals can be found in the care plan section) Acute Rehab PT Goals Patient Stated Goal: return to  PLOF PT Goal Formulation: With patient Time For Goal Achievement: 01/17/13 Potential  to Achieve Goals: Good  Visit Information  Last PT Received On: 01/10/13 Assistance Needed: +1 History of Present Illness: 52 year old female with 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       Prior Functioning  Home Living Family/patient expects to be discharged to:: Private residence Living Arrangements: Spouse/significant other Available Help at Discharge: Friend(s);Available 24 hours/day Type of Home: House Home Access: Stairs to enter [employer] of Steps: 2 Entrance Stairs-Rails: None Home Layout: Two level;1/2 bath on main level;Bed/bath upstairs Alternate Level Stairs-Number of Steps: flight and then 4 Alternate Level Stairs-Rails: Right Home Equipment: Crutches;Wheelchair - power;Shower seat (loftstrand crutches) Prior Function Level of Independence: Independent with assistive device(s) Communication Communication: No difficulties    Cognition  Cognition Arousal/Alertness: Awake/alert Behavior During Therapy: WFL for tasks assessed/performed Overall Cognitive Status: Within Functional Limits for tasks assessed    Extremity/Trunk Assessment Upper Extremity Assessment Upper Extremity Assessment: Defer to OT evaluation Lower Extremity Assessment Lower Extremity Assessment: LLE deficits/detail LLE Deficits / Details: Strength grossly 4/5, CRPS mostly effects pt's L extremities LLE: Unable to fully assess due to pain   Balance    End of Session PT - End of Session Equipment Utilized During Treatment: Gait belt Activity Tolerance: Patient limited by pain Patient left: with call bell/phone within reach;in chair Nurse Communication:  (RN medicated during session)  GP     Alaynna Kerwood,KATHrine E 01/10/2013, 11:51 AM Zenovia Jarred, PT, DPT 01/10/2013 Pager: [phone]

## 2013-01-10 NOTE — Progress Notes (Signed)
UR COMPLETED  

## 2013-01-11 ENCOUNTER — Encounter (HOSPITAL_COMMUNITY): Payer: Self-pay | Admitting: Neurosurgery

## 2013-01-11 NOTE — Progress Notes (Signed)
Occupational Therapy Treatment Patient Details Name: Courtney David MRN: [id number] DOB: 02/01/1961 Today's Date: 01/11/2013 Time: 1308-6578 OT Time Calculation (min): 24 min  OT Assessment / Plan / Recommendation  History of present illness 52 year old female with 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   OT comments  Pt progressing toward goals. Pt hopeful to return home tomorrow.  Follow Up Recommendations  No OT follow up;Supervision - Intermittent    Barriers to Discharge       Equipment Recommendations  Tub/shower bench;Other (comment)    Recommendations for Other Services    Frequency Min 2X/week   Progress towards OT Goals Progress towards OT goals: Progressing toward goals  Plan Discharge plan remains appropriate    Precautions / Restrictions Precautions Precautions: Fall;Cervical Precaution Comments: reviewed cervical precautions with pt Required Braces or Orthoses: Cervical Brace Cervical Brace: Soft collar   Pertinent Vitals/Pain See vitals    ADL  Lower Body Bathing: Simulated;Supervision/safety Where Assessed - Lower Body Bathing: Unsupported sit to stand Lower Body Dressing: Performed;Supervision/safety Where Assessed - Lower Body Dressing: Unsupported sit to stand Toilet Transfer: Performed;Supervision/safety Toilet Transfer Method: Sit to Barista: Comfort height toilet Toileting - Clothing Manipulation and Hygiene: Performed;Supervision/safety Where Assessed - Engineer, mining and Hygiene: Sit to stand from 3-in-1 or toilet Equipment Used:  (Lofstrands) Transfers/Ambulation Related to ADLs: supervision  ADL Comments: Pt able to cross ankles over knees today to perform LB dressing task. Educated pt on use of reacher for LB ADLs tasks as well as retrieving items safely.  Discussed use of tub bench for tub transfer but pt too fatigued at end of session and declining practicing tub transfer.      OT  Diagnosis:    OT Problem List:   OT Treatment Interventions:     OT Goals(current goals can now be found in the care plan section) Acute Rehab OT Goals Patient Stated Goal: return to PLOF OT Goal Formulation: With patient Time For Goal Achievement: 01/17/13 Potential to Achieve Goals: Good ADL Goals Pt Will Perform Grooming: with set-up;with supervision;standing Pt Will Perform Lower Body Bathing: with min guard assist;with supervision;with set-up;with adaptive equipment Pt Will Perform Upper Body Dressing: with min guard assist;with supervision;with set-up Pt Will Perform Lower Body Dressing: with min guard assist;with supervision;with set-up;with adaptive equipment Pt Will Transfer to Toilet: with supervision;regular height toilet;grab bars;ambulating Pt Will Perform Toileting - Clothing Manipulation and hygiene: with supervision Pt Will Perform Tub/Shower Transfer: with min guard assist;with supervision;tub bench  Visit Information  Last OT Received On: 01/11/13 Assistance Needed: +1 History of Present Illness: 52 year old female with 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    Subjective Data      Prior Functioning       Cognition  Cognition Arousal/Alertness: Awake/alert Behavior During Therapy: WFL for tasks assessed/performed Overall Cognitive Status: Within Functional Limits for tasks assessed    Mobility  Bed Mobility Bed Mobility: Rolling Right;Right Sidelying to Sit;Sitting - Scoot to Edge of Bed;Sit to Sidelying Right Rolling Right: 5: Supervision;With rail Right Sidelying to Sit: 5: Supervision Sitting - Scoot to Edge of Bed: 7: Independent Sit to Sidelying Right: 5: Supervision Transfers Transfers: Sit to Stand;Stand to Sit Sit to Stand: 5: Supervision;From bed;From toilet Stand to Sit: 5: Supervision;To bed;To toilet    Exercises      Balance     End of Session OT - End of Session Equipment Utilized During Treatment: Cervical  collar Activity Tolerance: Patient limited by fatigue Patient left: in bed;with call bell/phone within reach  GO   01/11/2013 Cipriano Mile OTR/L Pager [phone] Office [phone]    Cipriano Mile 01/11/2013, 1:44 PM

## 2013-01-11 NOTE — Progress Notes (Signed)
Patient ID: Courtney David, female   DOB: March 17, 1961, 52 y.o.   MRN: [id number] Stable, wound dry, voice ok. Dc in am

## 2013-01-11 NOTE — Clinical Social Work Note (Signed)
CSW received consult for possible SNF placement. PT currently recommending home health and no OT follow up. CSW signing off.   Please re consult if discharge disposition changes.  Darlyn Chamber, MSW, LCSWA Clinical Social Work [phone]

## 2013-01-11 NOTE — Progress Notes (Signed)
Talked to patient about DCP; home health care choices; Patient chose Courtney David for home health care services; Attending MD, please order HHPT at discharge; B Keimon Basaldua RN,BSN,.MHA

## 2013-01-11 NOTE — Progress Notes (Signed)
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    Follow Up Recommendations  Home health PT;Supervision - Intermittent     Does the patient have the potential to tolerate intense rehabilitation     Barriers to Discharge        Equipment Recommendations  None recommended by PT    Recommendations for Other Services    Frequency Min 5X/week   Progress towards PT Goals Progress towards PT goals: Progressing toward goals  Plan Current plan remains appropriate    Precautions / Restrictions Precautions Precautions: Fall;Cervical Precaution Comments: reviewed cervical precautions with pt Required Braces or Orthoses: Cervical Brace Cervical Brace: Soft collar   Pertinent Vitals/Pain L LE and cervical pain 7-8/10 pre-activity and reports increased with activity however RN states pt has already received pain meds and muscle relaxer as RN premedicated pt for therapy session.  Repositioned to comfort    Mobility  Bed Mobility Bed Mobility: Not assessed Rolling Right: 5: Supervision;With rail Right Sidelying to Sit: 5: Supervision Sitting - Scoot to Edge of Bed: 7: Independent Sit to Sidelying Right: 5: Supervision Transfers Transfers: Sit to Stand;Stand to Sit Sit to Stand: With upper extremity assist;5: Supervision;From chair/3-in-1 Stand to Sit: With upper extremity assist;To chair/3-in-1;5: Supervision Details for Transfer Assistance: verbal cues for neck precautions, min/guard for safety Ambulation/Gait Ambulation/Gait Assistance: 5: Supervision Ambulation Distance (Feet): 150 Feet Assistive  device: Lofstrands Ambulation/Gait Assistance Details: one standing rest break required due to muscle spasm/knot in R upper trap so PT applied pressure to ease pain which pt stated felt better (already given pain meds and muscle relaxer), pt demonstrated better cervical precautions with gait today Gait Pattern: Step-to pattern;Decreased stance time - left;Decreased step length - right;Antalgic;Decreased dorsiflexion - left Gait velocity: Slow gait speed Stairs: Yes Stairs Assistance: 4: Min guard Stairs Assistance Details (indicate cue type and reason): R rail and L loftstrand crutch, pt did better with hip/knee flexion upon descending to maintain straight spine today, verbal cues for sequence, also educated on assisting person hand placement and foot placement for safe stair technique Stair Management Technique: One rail Right;With crutches;Step to pattern;Forwards Number of Stairs: 2    Exercises     PT Diagnosis:    PT Problem List:   PT Treatment Interventions:     PT Goals (current goals can now be found in the care plan section) Acute Rehab PT Goals Patient Stated Goal: return to PLOF  Visit Information  Last PT Received On: 01/11/13 Assistance Needed: +1 History of Present Illness: 52 year old female with 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

## 2013-01-12 MED ORDER — OXYCODONE-ACETAMINOPHEN 5-325 MG PO TABS: 1.0000 | ORAL_TABLET | ORAL | Status: DC | PRN

## 2013-01-12 MED ORDER — CYCLOBENZAPRINE HCL 5 MG PO TABS: 10.0000 mg | ORAL_TABLET | Freq: Three times a day (TID) | ORAL | Status: DC | PRN

## 2013-01-12 MED ORDER — CYCLOBENZAPRINE HCL 10 MG PO TABS: 10.0000 mg | ORAL_TABLET | Freq: Three times a day (TID) | ORAL | Status: AC | PRN

## 2013-01-12 NOTE — Progress Notes (Signed)
Patient discharged home as ordered, alert & orientedx4 in no acute distress. No complaints. Pertinent documents signed and copies given to pt.. Explained the discharged documents including meds,etc. Verbalized understanding. Left facility in wheelchair accompanied by volunteer staff and a friend.

## 2013-01-12 NOTE — Discharge Summary (Signed)
Physician Discharge Summary  Patient ID: Courtney David MRN: [id number] DOB/AGE: 54-Jul-1962 54 y.o.  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 C4-C5  stenosis, C6-7 stenosis. Status post fusion 5-6. History of reflex  sympathetic dystrophy.   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 C4-C5  stenosis, C6-7 stenosis. Status post fusion 5-6. History of reflex  sympathetic dystrophy.  Active Problems:   * No active hospital problems. *   Discharged Condition: good  Hospital Course: pt admitted on day of surgery  - underwent procedure below  - pt doing well, voice  Good, swallowing well  Consults: None    Treatments: surgery: Removal of 5-6 plate and screws. C3-4, 4-5, and 6-7  diskectomy, decompression of spinal cord, interbody fusion with cages,  plate from Y8-M5, microscope.   Discharge Exam: Blood pressure 148/94, pulse 82, temperature 98 F (36.7 C), temperature source Oral, resp. rate 18, height 5\' 6"  (1.676 m), weight 72.576 kg (160 lb), SpO2 99.00%. Wound:c/d/i  Disposition: home     Medication List    STOP taking these medications       ibuprofen 200 MG tablet  Commonly known as:  ADVIL,MOTRIN      TAKE these medications       atorvastatin 40 MG tablet  Commonly known as:  LIPITOR  Take 40 mg by mouth daily.     cloNIDine 0.1 MG tablet  Commonly known as:  CATAPRES  Take 0.05 mg by mouth 2 (two) times daily.     cyclobenzaprine 5 MG tablet  Commonly known as:  FLEXERIL  Take 5 mg by mouth 3 (three) times daily as needed for muscle spasms.     cyclobenzaprine 10 MG tablet  Commonly known as:  FLEXERIL  Take 1 tablet (10 mg total) by mouth 3 (three) times daily as needed for muscle spasms.     omeprazole 40 MG capsule  Commonly known as:  PRILOSEC  Take 40 mg by mouth daily.     oxyCODONE-acetaminophen 5-325 MG per tablet  Commonly known as:  PERCOCET/ROXICET   Take 1-2 tablets by mouth every 4 (four) hours as needed for pain.     senna 8.6 MG Tabs tablet  Commonly known as:  SENOKOT  Take 1 tablet by mouth 2 (two) times daily.     tapentadol 50 MG Tabs tablet  Commonly known as:  NUCYNTA  Take 50 mg by mouth 2 (two) times daily as needed.     topiramate 25 MG tablet  Commonly known as:  TOPAMAX  Take 25-50 mg by mouth 2 (two) times daily. Pt takes 1 tablet in the morning and 2 tablets at night     venlafaxine XR 75 MG 24 hr capsule  Commonly known as:  EFFEXOR-XR  Take 75 mg by mouth daily.     Vitamin D (Ergocalciferol) 50000 UNITS Caps capsule  Commonly known as:  DRISDOL  Take 50,000 Units by mouth once a week.         SignedClydene Fake, MD 01/12/2013, 8:42 AM

## 2013-01-12 NOTE — Progress Notes (Signed)
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 All education has been completed and the patient has no further questions. See below for any follow-up Occupational Therapy or equipment needs. OT to sign off. Thank you for referral.    Follow Up Recommendations  No OT follow up;Supervision - Intermittent    Barriers to Discharge       Equipment Recommendations  Other (comment) (no equipment needs at this time)    Recommendations for Other Services    Frequency Min 2X/week   Progress towards OT Goals Progress towards OT goals: Progressing toward goals  Plan Discharge plan remains appropriate    Precautions / Restrictions Precautions Precautions: Fall;Cervical Precaution Comments: reviewed cervical precautions with pt Required Braces or Orthoses: Cervical Brace Cervical Brace: Soft collar Restrictions Weight Bearing Restrictions: No   Pertinent Vitals/Pain No pain reported    ADL  Tub/Shower Transfer: Supervision/safety Tub/Shower Transfer Method: Science writer: Walk in shower ADL Comments: Pt and friend present in room and educated for shower transfer. Discussed positioning of chair and hand held shower head. Pt educated on placement of crutches for transfer. Pt and friend educated on soft collar  and dressing care.   - Pt has shower instead of tub shower. Information provided by friend present in room. So shower was addressed instead of tub shower transfer.  OT Diagnosis:    OT Problem List:   OT Treatment Interventions:     OT Goals(current goals can now be found in the  care plan section) Acute Rehab OT Goals Patient Stated Goal: return to PLOF OT Goal Formulation: With patient Time For Goal Achievement: 01/17/13 Potential to Achieve Goals: Good ADL Goals Pt Will Perform Grooming: with set-up;with supervision;standing Pt Will Perform Lower Body Bathing: with min guard assist;with supervision;with set-up;with adaptive equipment Pt Will Perform Upper Body Dressing: with min guard assist;with supervision;with set-up Pt Will Perform Lower Body Dressing: with min guard assist;with supervision;with set-up;with adaptive equipment Pt Will Transfer to Toilet: with supervision;regular height toilet;grab bars;ambulating Pt Will Perform Toileting - Clothing Manipulation and hygiene: with supervision Pt Will Perform Tub/Shower Transfer: with min guard assist;with supervision;tub bench  Visit Information  Last OT Received On: 01/12/13 Assistance Needed: +1 History of Present Illness: 52 year old female with 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    Subjective Data      Prior Functioning  Home Living Family/patient expects to be discharged to:: Private residence Living Arrangements: Spouse/significant other Available Help at Discharge: Friend(s);Available 24 hours/day Type of Home: House Home Access: Stairs to enter [employer] of Steps: 2 Entrance Stairs-Rails: None Home Layout: Two level;1/2 bath on main level;Bed/bath upstairs Alternate Level Stairs-Number of Steps: flight and then 4 Alternate Level Stairs-Rails: Right    Cognition  Cognition Arousal/Alertness: Awake/alert Behavior During Therapy: WFL for tasks assessed/performed Overall Cognitive Status: Within Functional Limits for tasks assessed    Mobility  Bed Mobility Bed Mobility: Not assessed Transfers Sit to Stand: 6: Modified independent (Device/Increase time);With upper extremity assist;With armrests;From chair/3-in-1 Stand to Sit: 6: Modified independent  (Device/Increase time);With upper extremity assist;With armrests;To chair/3-in-1    Exercises  Balance     End of Session OT - End of Session Activity Tolerance: Patient tolerated treatment well Patient left: in chair;with call bell/phone within reach  GO     Harolyn Rutherford 01/12/2013, 11:38 AM Pager: [phone]

## 2013-01-12 NOTE — Progress Notes (Signed)
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  Appropriate to d/c home from PT standpoint.    Follow Up Recommendations  Home health PT;Supervision - Intermittent     Does the patient have the potential to tolerate intense rehabilitation     Barriers to Discharge        Equipment Recommendations  None recommended by PT    Recommendations for Other Services    Frequency Min 5X/week   Progress towards PT Goals Progress towards PT goals: Progressing toward goals  Plan Current plan remains appropriate    Precautions / Restrictions Precautions Precautions: Fall;Cervical Precaution Comments: reviewed cervical precautions with pt Required Braces or Orthoses: Cervical Brace Cervical Brace: Soft collar Restrictions Weight Bearing Restrictions: No       Mobility  Bed Mobility Bed Mobility: Not assessed Transfers Transfers: Sit to Stand;Stand to Sit Sit to Stand: 6: Modified independent (Device/Increase time);With upper extremity assist;With armrests;From chair/3-in-1 Stand to Sit: 6: Modified independent (Device/Increase time);With upper extremity assist;With armrests;To chair/3-in-1 Ambulation/Gait Ambulation/Gait Assistance: 5: Supervision Ambulation Distance (Feet): 200 Feet Assistive device: Lofstrands Ambulation/Gait Assistance Details: No rest break needed today.  She does report increased soreness through shoulders/upper trap area as distance increased.  Pt cautious & steady Gait Pattern: Step-to pattern;Decreased stance time - left;Decreased step length - right;Antalgic;Decreased dorsiflexion - left Stairs: Yes Stairs Assistance: 5: Supervision Stairs Assistance Details  (indicate cue type and reason): Supervision for safety.  Pt states stairs make her a little nervous due to she is unable to look down at her feet to see where she is stepping.   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

## 2013-01-12 NOTE — Care Management Note (Signed)
    Page 1 of 1   01/12/2013     4:40:30 PM   CARE MANAGEMENT NOTE 01/12/2013  Patient:  [facility]   Account Number:  [id number]  Date Initiated:  01/11/2013  Documentation initiated by:  Jiles Crocker  Subjective/Objective Assessment:   ADMITTED FOR SURGERY     Action/Plan:   CM FOLOWING FOR DCP; SEE NOTE BELOW   Anticipated DC Date:  01/12/2013   Anticipated DC Plan:  HOME W HOME HEALTH SERVICES      DC Planning Services  CM consult      Choice offered to / List presented to:          [facility] arranged  HH-2 PT      [facility] agency  [facility]   Status of service:  Completed, signed off Medicare Important Message given?  NA - LOS <3 / Initial given by admissions (If response is "NO", the following Medicare IM given date fields will be blank) Date Medicare IM given:   Date Additional Medicare IM given:    Discharge Disposition:    Per UR Regulation:  Reviewed for med. necessity/level of care/duration of stay  If discussed at Long Length of Stay Meetings, dates discussed:    Comments:  01/12/13 16:37 CM called [facility] to notify of discharge. No other CM needs communicated, Freddy Jaksch, BSN, [place], [phone].   01/11/2013 11:25 AM Talked to patient about DCP; home health care choices; [facility] chose [facility] for [facility] health care services; Attending MD, please order HHPT at discharge; B Chandler RN,BSN,.MHA

## 2013-05-15 ENCOUNTER — Ambulatory Visit: Payer: Self-pay | Admitting: Neurology

## 2013-06-13 ENCOUNTER — Ambulatory Visit (INDEPENDENT_AMBULATORY_CARE_PROVIDER_SITE_OTHER): Payer: Medicare Other | Admitting: Neurology

## 2013-06-13 ENCOUNTER — Encounter: Payer: Self-pay | Admitting: Neurology

## 2013-06-13 VITALS — BP 110/74 | HR 88 | Temp 98.2°F | Ht 66.93 in | Wt 167.2 lb

## 2013-06-13 DIAGNOSIS — H8110 Benign paroxysmal vertigo, unspecified ear: Secondary | ICD-10-CM

## 2013-06-13 NOTE — Progress Notes (Signed)
a [facility] Clinic Note - Initial Visit   Date: 06/13/2013    Courtney David MRN: [id number] DOB: 01/27/1961   Dear Dr Eloise HarmanPaterson:  Thank you for your kind referral of Courtney David for consultation of migraines. Although her history is well known to you, please allow us to reiterate it for the purpose of our medical record. The patient was accompanied to the clinic by self who also provides collateral information.     History of Present Illness: Courtney David is a 53 y.o. right-handed Caucasian female with history of reflex sympathetic dystrophy with chronic leg pain s/p spinal cord stimulator which was recently removed, TMJ, migraines with vertigo, GERD, chronic R shoulder pain, cervical spondylosis s/p disectomy C3-4, C4-5, C6-7 with fusion (C3-7), CTS s/p release,  presenting for evaluation of migraines.    She reports having along history which started in her 4th grade.  She would loose her vision and dysarthria, develop nausea/vomiting, and then would have holocephalic headache.  She took Midrin as needed.  Headaches continued intermittently and in 1997, she developed dizziness without associated headache.  Work-up for dizziness was extensive and she continues to have this for prolonged period of time until she was given a trial of lorazepam, which alleviated symptoms.  Dizziness is described as room spinning, lasting 30-3045min if she has lorazepam.  It is exacerbated by abrupt head and eye movements.  It is improved by laying down.  Last episode was about one month ago. Frequency is 3-4 per year.  She has associated nausea.  She had had meclizine but has not tried vestibular therapy.  She fell and broke her nose in 1998.  She was under the care of Dr. Meryl CrutchAdelman for a long time.  She was on effexor 150mg , atenolol, lorazepam 0.5mg  as needed.  She reports having improvement of symptoms over the years.  She was diagnosed with migranous vertigo and was tried on a number  of medication including keppra 250mg  BID, atenolol, clonidine, verapamil, antivert, flexiril, neurontin, and effexor with little benefit.    She also reports having RSD related to work-related accident.  She had spinal cord stimulator placed in 2013 but recently had it removed.  For chronic pain, she has has had ketamine infusion for RSD. [name].  She has seen Dr. Kristeen Missan Caffrey (ortho), Loretha StaplerFranciso Naverra [facility]), Barrie DunkerJames North (Pain Management, [place]), Santiago GladMarshall Freeman (headaches, Dr. Isaiah SergeBertram, Dr. Jeral FruitBotero (neurosurgery), and Dr. Lenor Coffinori Brodie (GI).  Out-side paper records, electronic medical record, and images have been reviewed where available and summarized as:  Labs 03/18/2013:  Na 144, Cr 0.8, K 4.4, Chl 110, AST 18, ALT 19, LDL 104 TG 161  HDL 42  Cervical myelogram 12/08/2012:  1. C5-C6 ACDF with solid interbody arthrodesis and no hardware loosening. Posterior lower cervical spine epidural space metallic stimulator lead present from the C5 pedicle to the C7 pedicle level. There does appear to be a degree of spinal stenosis at C5 and C6 related in part to endplate osteophytes. AP thecal sac is reduced to 5-6 mm, see series 2, images 54 and 62.  2. 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 Spinal cord mass effect at C4-C5 is present and greater on the right. Thecal sac detail is limited at C6-C7 due to hardware streak artifact, but flattening of the spinal cord there also is suspected. There is multifactorial moderate to severe right C5 foraminal stenosis.  3. Trace spondylolisthesis at C3-C4 with small central disc protrusion.  Mild spinal stenosis with minimal mass effect on the  spinal cord. Mild to moderate left C4 foraminal stenosis.    Past Medical History  Diagnosis Date  . RSD (reflex sympathetic dystrophy)     mainly left side/ right hand  . Hypertension     takes Clonidine daily  . Hyperlipidemia     takes Lipitor daily  . Constipation     takes  Senokot daily  . Muscle spasms of head and/or neck     takes Flexeril daily prn  . GERD (gastroesophageal reflux disease)     takes Omeprazole daily  . History of migraine     takes Topamax daily  . PONV (postoperative nausea and vomiting)   . RSD (reflex sympathetic dystrophy)   . Short-term memory loss     takes Effexor daily  . Family history of anesthesia complication     pts grandma frontal lobe died bc of anesthesia  . Vertigo   . Numbness and tingling     left arm  . Arthritis   . Joint pain   . Joint swelling   . History of gout   . Back pain   . Urinary frequency     Past Surgical History  Procedure Laterality Date  . Tmj arthroplasty    . Abdominal hysterectomy    . Ovary removed    . Neck fusion    . Shoulder surgery      screw right shoulder  . Wrist surgery      cyst removed from both wrists  . Spinal cord stimulator implant    . Knee arthroscopy      bil  . Nose surgery      d/t broke nose  . Colonoscopy    . Anterior cervical decomp/discectomy fusion N/A 01/09/2013    Procedure: ANTERIOR CERVICAL DECOMPRESSION/DISCECTOMY FUSION CERVICALTHREE-FOUR FOUR-FIVE,FIVE-SIX  REMOVAL OLD PLATE CERVICAL FIVE-SIX PLATING CERVICAL THREE TO CERVICAL SIX- SEVEN;  Surgeon: Karn Cassis, MD;  Location: [facility];  Service: Neurosurgery;  Laterality: N/A;     Medications:  Current Outpatient Prescriptions on File Prior to Visit  Medication Sig Dispense Refill  . atorvastatin (LIPITOR) 40 MG tablet Take 40 mg by mouth daily.      . cloNIDine (CATAPRES) 0.1 MG tablet Take 0.05 mg by mouth 2 (two) times daily.       . cyclobenzaprine (FLEXERIL) 10 MG tablet Take 1 tablet (10 mg total) by mouth 3 (three) times daily as needed for muscle spasms.  50 tablet  2  . cyclobenzaprine (FLEXERIL) 5 MG tablet Take 5 mg by mouth 3 (three) times daily as needed for muscle spasms.      [name] omeprazole (PRILOSEC) 40 MG capsule Take 40 mg by mouth daily.        [name]  oxyCODONE-acetaminophen (PERCOCET/ROXICET) 5-325 MG per tablet Take 1-2 tablets by mouth every 4 (four) hours as needed for pain.  51 tablet  0  . senna (SENOKOT) 8.6 MG TABS tablet Take 1 tablet by mouth 2 (two) times daily.      . tapentadol (NUCYNTA) 50 MG TABS tablet Take 50 mg by mouth 2 (two) times daily as needed.      . topiramate (TOPAMAX) 25 MG tablet Take 25-50 mg by mouth 2 (two) times daily. Pt takes 1 tablet in the morning and 2 tablets at night      . venlafaxine (EFFEXOR-XR) 75 MG 24 hr capsule Take 75 mg by mouth daily.        [name]  Vitamin D, Ergocalciferol, (DRISDOL) 50000 UNITS CAPS Take 50,000 Units by mouth once a week.       No current facility-administered medications on file prior to visit.    Allergies:  Allergies  Allergen Reactions  . Codeine Nausea And Vomiting    Nausea, vomiting  . Sulfa Antibiotics     Hives, itching    Family History: Family History  Problem Relation Age of Onset  . Colon polyps Paternal Grandfather   . Heart disease Mother   . Diabetes Mother     Social History: History   Social History  . Marital Status: Single    Spouse Name: N/A    Number of Children: N/A  . Years of Education: N/A   Occupational History  . Not on file.   Social History Main Topics  . Smoking status: Current Every Day Smoker -- 1.00 packs/day for 30 years    Types: Cigarettes  . Smokeless tobacco: Never Used  . Alcohol Use: No  . Drug Use: No  . Sexual Activity: No   Other Topics Concern  . Not on file   Social History Narrative  . No narrative on file    Review of Systems:  CONSTITUTIONAL: No fevers, chills, night sweats, or weight loss.   EYES: No visual changes or eye pain ENT: No hearing changes.  No history of nose bleeds.   RESPIRATORY: No cough, wheezing and shortness of breath.   CARDIOVASCULAR: Negative for chest pain, and palpitations.   GI: Negative for abdominal discomfort, blood in stools or black stools.  No recent change in  bowel habits.   GU:  No history of incontinence.   MUSCLOSKELETAL: ++history of joint pain or swelling.  No myalgias.   SKIN: Negative for lesions, rash, and itching.   HEMATOLOGY/ONCOLOGY: Negative for prolonged bleeding, bruising easily, and swollen nodes.  ENDOCRINE: Negative for cold or heat intolerance, polydipsia or goiter.   PSYCH:  +depression or anxiety symptoms.   NEURO: As Above.   Vital Signs:  BP 110/74  Pulse 88  Temp(Src) 98.2 F (36.8 C)  Ht 5' 6.93" (1.7 m)  Wt 167 lb 4 oz (75.864 kg)  BMI 26.25 kg/m2  SpO2 98%  Neurological Exam: MENTAL STATUS including orientation to time, place, person, recent and remote memory, attention span and concentration, language, and fund of knowledge is normal.  Speech is not dysarthric.  CRANIAL NERVES: II:  No visual field defects.  Unremarkable fundi.   III-IV-VI: Pupils equal round and reactive to light.  Normal conjugate, extra-ocular eye movements in all directions of gaze.  No nystagmus.  No ptosis.   V:  Normal facial sensation.   VII:  Normal facial symmetry and movements.  No pathologic facial reflexes.  VIII:  Normal hearing and vestibular function.   IX-X:  Normal palatal movement.   XI:  Normal shoulder shrug and head rotation.   XII:  Normal tongue strength and range of motion, no deviation or fasciculation.  MOTOR:  Left side is antigravity, but unable to assess strength due to pain-limited weakness.  Left lower leg and foot is atrophied with redness.  No fasciculations or abnormal movements.  No pronator drift.  Tone is normal.    Right Upper Extremity:    Left Upper Extremity:    Deltoid  5/5      Biceps  5/5      Triceps  5/5      Wrist extensors  5/5      Wrist flexors  5/5      Finger extensors  5/5      Finger flexors  5/5      Dorsal interossei  5/5      Abductor pollicis  5/5      Tone (Ashworth scale)  0      Right Lower Extremity:    Left Lower Extremity:    Hip flexors  5/5      Hip extensors   5/5      Knee flexors  5/5      Knee extensors  5/5      Dorsiflexors  5/5   Dorsiflexors  2+/5   Plantarflexors  5/5   Plantarflexors  0/5   Toe extensors  5/5   Toe extensors  0/5   Toe flexors  5/5   Toe flexors  0/5   Tone (Ashworth scale)  0  Tone (Ashworth scale)  0   MSRs:  Right                                                                 Left brachioradialis 2+  brachioradialis 2+  biceps 2+  biceps 2+  triceps 2+  triceps 2+  patellar 2+  patellar 2+  ankle jerk 0  ankle jerk Not assessed at pt's request  Hoffman no  Hoffman no   SENSORY:  Hyperesthesia on the left leg to light touch and vibration, reduced perception of pin prick on the LUE.  Sensation to all modalities on the right side is normal.  COORDINATION/GAIT: Normal finger-to- nose-finger.  Intact rapid alternating movements bilaterally in the arms.  She uses bilateral forearm crutches for gait which appears antalgic favoring the right side.      IMPRESSION/PLAN: Courtney David is a 53 year-old female presenting for evaluation of episodic dizziness.  Based on her history, I feel that her symptoms are most suggestive of benign paroxsymal positional vertigo, moreso than migrainous vertigo.  Her symptoms are episodic, acutely worse with abrupt head movements, associated with nausea, and the absence of headache, photophobia, phonophobia, with quick response to benzodiazepines is consistent with BPPV.  I explained that I agree with Dr. Neale Burly that for migraines with vertigo, lorazepam is not used so even I would not recommend it.  However, since I feel that symptoms due to BPPV, there is a role for benzodiazepam.    Since her symptoms are very infrequent, occuring only 3-4 times per year, I will only write for 5 tablets of lorazepam 0.5mg  per year for use with acute spells, which she is in agreement of.  She tells me that she does not need any refills with medications at this time.  As for her effexor 75mg , I would recommend  continuing that for now.  I do not feel that there is an indication for its use in BPPV, but will not make any adjustments as it is most likely also helping with overall pain and mood.    If symptoms acutely worsen, she may benefit from a trial of vestibular therapy. I will see her back in 57-months or sooner as needed.    The duration of this appointment visit was 50 minutes of face-to-face time with the patient.  Greater than 50% of this time was spent in counseling, explanation of  diagnosis, planning of further management, and coordination of care.   Thank you for allowing me to participate in patient's care.  If I can answer any additional questions, I would be pleased to do so.    Sincerely,    Galo Sayed K. Posey Pronto, DO

## 2013-06-13 NOTE — Patient Instructions (Signed)
I feel that your symptoms are most suggestive of benign paroxysmal positional vertigo.  1.  Ok to use lorazepam 0.5mg  only when severe episode of dizziness occurs 2.  Continue remainder of medications as you are 3.  Return to clinic 266-months

## 2013-06-13 NOTE — Progress Notes (Signed)
Note faxed.

## 2013-12-19 ENCOUNTER — Ambulatory Visit: Payer: Self-pay | Admitting: Neurology

## 2013-12-30 ENCOUNTER — Ambulatory Visit: Payer: Self-pay | Admitting: Neurology

## 2014-01-02 ENCOUNTER — Encounter: Payer: Self-pay | Admitting: Neurology

## 2014-01-02 ENCOUNTER — Ambulatory Visit (INDEPENDENT_AMBULATORY_CARE_PROVIDER_SITE_OTHER): Payer: Medicare Other | Admitting: Neurology

## 2014-01-02 VITALS — BP 100/70 | HR 80 | Wt 162.4 lb

## 2014-01-02 DIAGNOSIS — G3184 Mild cognitive impairment, so stated: Secondary | ICD-10-CM

## 2014-01-02 LAB — TSH: TSH: 2.029 u[IU]/mL (ref 0.350–4.500)

## 2014-01-02 LAB — VITAMIN B12: VITAMIN B 12: 356 pg/mL (ref 211–911)

## 2014-01-02 NOTE — Progress Notes (Signed)
Note and referral faxed

## 2014-01-02 NOTE — Patient Instructions (Addendum)
1.  Check blood work 2.  Neuropsychiatric testing 3.  Please discuss with your pain medicine doctor about seeing if some of the medications, specifically topamax, is contributing to your memory problems 4.  Return to clinic in 65-months

## 2014-01-02 NOTE — Progress Notes (Signed)
Follow-up Visit   Date: 01/02/2014    Courtney David MRN: [id number] DOB: [birth year]   Interim History: Courtney David is a 53 y.o. right-handed Caucasian female with history of reflex sympathetic dystrophy with chronic leg pain s/p spinal cord stimulator which was recently removed, TMJ, migraines with vertigo, GERD, chronic R shoulder pain, cervical spondylosis s/p disectomy C3-4, C4-5, C6-7 with fusion (C3-7), CTS s/p release returning to the clinic for follow-up of BPPV and new complaints of memory changes.    History of present illness: She reports having along history which started in her 4th grade. She would loose her vision and dysarthria, develop nausea/vomiting, and then would have holocephalic headache. She took Midrin as needed. Headaches continued intermittently and in 1997, she developed dizziness without associated headache. Work-up for dizziness was extensive and she continues to have this for prolonged period of time until she was given a trial of lorazepam, which alleviated symptoms. Dizziness is described as room spinning, lasting 30-70min if she has lorazepam. It is exacerbated by abrupt head and eye movements. It is improved by laying down.  Frequency is 3-4 per year. She has associated nausea. She had had meclizine but has not tried vestibular therapy. She fell and broke her nose in 1998. She was under the care of Dr. Meryl Crutch for a long time. She was on effexor , atenolol, lorazepam 0.5mg  as needed. She reports having improvement of symptoms over the years. She was diagnosed with migranous vertigo and was tried on a number of medication including keppra  BID, atenolol, clonidine, verapamil, antivert, flexiril, neurontin, and effexor with little benefit.   She also reports having RSD related to work-related accident. She had spinal cord stimulator placed in 2013 but recently had it removed. For chronic pain, she has has had ketamine infusion for RSD. [name]   She has  seen Dr. Kristeen Miss (ortho), Loretha Stapler [facility]), Barrie Dunker (Pain Management, [place]), Santiago Glad (headaches, Dr. Isaiah Serge, Dr. Jeral Fruit (neurosurgery), and Dr. Lenor Coffin (GI).   UPDATE 01/02/2014: At her last visit, we discuss diagnosis of likely BPPV moreso than migrainous vertigo.  She is here for 58-month follow-up appointment.  She reports being under a great deal of stress because her mother has CHF and has been given survival of 4-6 months.  She complains of short-term memory problems and also noticed problems in her long-term memory.  This past week, she threw her car keys away in the trash and did not notice it until her roommate found it in the trash.  She is very emotional and started crying when she could not remember her mother's age.  Denies having any problems driving, but says that she had to look up where our office was today because she did not remember it.  No car accidents.  She has had a 3 spells of vertigo which has responded to lorazepam.  She denies any new medication changes and reportedly says her mood is good.   Medications:  Current Outpatient Prescriptions on File Prior to Visit  Medication Sig Dispense Refill  . atorvastatin (LIPITOR) 40 MG tablet Take 40 mg by mouth daily.      . cloNIDine (CATAPRES) 0.1 MG tablet Take 0.05 mg by mouth 2 (two) times daily.       . cyclobenzaprine (FLEXERIL) 10 MG tablet Take 1 tablet (10 mg total) by mouth 3 (three) times daily as needed for muscle spasms.  50 tablet  2  . cyclobenzaprine (FLEXERIL) 5 MG tablet  Take 5 mg by mouth 3 (three) times daily as needed for muscle spasms.      [name] LORazepam (ATIVAN) 0.5 MG tablet Take 0.5 mg by mouth every 8 (eight) hours as needed for anxiety.      [name] omeprazole (PRILOSEC) 40 MG capsule Take 40 mg by mouth daily.        [name] senna (SENOKOT) 8.6 MG TABS tablet Take 1 tablet by mouth 2 (two) times daily.      . tapentadol (NUCYNTA) 50 MG TABS tablet Take 50 mg by mouth 2  (two) times daily as needed.      . topiramate (TOPAMAX) 25 MG tablet Take 25-50 mg by mouth 2 (two) times daily. Pt takes 1 tablet in the morning and 2 tablets at night      . venlafaxine (EFFEXOR-XR) 75 MG 24 hr capsule Take 75 mg by mouth daily.        . Vitamin D, Ergocalciferol, (DRISDOL) 50000 UNITS CAPS Take 50,000 Units by mouth once a week.       No current facility-administered medications on file prior to visit.    Allergies:  Allergies  Allergen Reactions  . Codeine Nausea And Vomiting    Nausea, vomiting  . Sulfa Antibiotics     Hives, itching     Review of Systems:  CONSTITUTIONAL: No fevers, chills, night sweats, or weight loss.   EYES: No visual changes or eye pain ENT: No hearing changes.  No history of nose bleeds.   RESPIRATORY: No cough, wheezing and shortness of breath.   CARDIOVASCULAR: Negative for chest pain, and palpitations.   GI: Negative for abdominal discomfort, blood in stools or black stools.  No recent change in bowel habits.   GU:  No history of incontinence.   MUSCLOSKELETAL: No history of joint pain or swelling.  No myalgias.   SKIN: Negative for lesions, rash, and itching.   ENDOCRINE: Negative for cold or heat intolerance, polydipsia or goiter.   PSYCH:  ++ depression or anxiety symptoms.   NEURO: As Above.   Vital Signs:  BP 100/70  Pulse 80  Wt 162 lb 7 oz (73.681 kg)  SpO2 96%  Neurological Exam: MOCA test:  Testing was prematurely terminated this patient was very emotional and crying throughout the first 3 segments up to memory. She missed one point on drawing the clock, otherwise naming and visual spatial was intact.    She is oriented to month, year, the day, place, and person.  She is very tearful during the examination today making it difficult to accurately assess her cognitive changes.  CRANIAL NERVES:  Pupils equal round and reactive to light.  Normal conjugate, extra-ocular eye movements in all directions of gaze.  Face is  symmetric.   MOTOR:  Motor strength is 5/5 proximal upper extremities with 5-/5 the distal hand muscles.  Right lower extremity is 5/5, left lower extremity is antigravity proximally no movement distally.  Left lower leg and foot is atrophied  MSRs:  Reflexes are 2+/4 throughout, except absent at the Achilles bilaterally.  SENSORY:  Hyperesthesia to vibration in the left lower extremity.  COORDINATION/GAIT:  She uses bilateral forearm crutches for gait which appears antalgic favoring the right side.     Data: Labs 03/18/2013: Na 144, Cr 0.8, K 4.4, Chl 110, AST 18, ALT 19, LDL 104 TG 161 HDL 42   Cervical myelogram 12/08/2012:  1. C5-C6 ACDF with solid interbody arthrodesis and no hardware loosening. Posterior lower cervical spine epidural  space metallic stimulator lead present from the C5 pedicle to the C7 pedicle level. There does appear to be a degree of spinal stenosis at C5 and C6 related in part to endplate osteophytes. AP thecal sac is reduced to 5-6 mm, see series 2, images 54 and 62.  2. 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 Spinal cord mass effect at C4-C5 is present and greater on the right. Thecal sac detail is limited at C6-C7 due to hardware streak artifact, but flattening of the spinal cord there also is suspected. There is multifactorial moderate to severe right C5 foraminal stenosis.  3. Trace spondylolisthesis at C3-C4 with small central disc protrusion. Mild spinal stenosis with minimal mass effect on the  spinal cord. Mild to moderate left C4 foraminal stenosis.    IMPRESSION/PLAN: 1.  Mild cognitive impairment  - Multifactorial and likely due to secondary to stress related to her mother's health problems as well as possible medication effect specifically, Topamax. I do not suspect that she has underlying dementia, however patient is very bothered and by her symptoms so will obtain neuropsychological testing as well as check for TSH, vitamin B12,  vitamin E levels.  - I have asked her to discuss her medications with her pain management physician and see if she can be weaned off any of them, notably topamax since this is well known to cause cognitive side effects  2.  BPPV  - Relatively stable   3.  Return to clinic in 71-months   The duration of this appointment visit was 30 minutes of face-to-face time with the patient.  Greater than 50% of this time was spent in counseling, explanation of diagnosis, planning of further management, and coordination of care.   Thank you for allowing me to participate in patient's care.  If I can answer any additional questions, I would be pleased to do so.    Sincerely,    Lacresha Fusilier K. Allena Katz, DO

## 2014-01-05 LAB — VITAMIN E
GAMMA-TOCOPHEROL (VIT E): 2.8 mg/L (ref <=4.3)
Vitamin E (Alpha Tocopherol): 10.9 mg/L (ref 5.7–19.9)

## 2014-02-14 ENCOUNTER — Telehealth: Payer: Self-pay | Admitting: Neurology

## 2014-02-14 NOTE — Telephone Encounter (Signed)
Neuropsychological testing performed 01/20/2014 at [facility] suggests cognitive disorder due to medication effect from topamax and recommends discontinuing it.  If not improvement following d/c topamax and symptoms do not improve, MRI brain can be performed.  Niranjan Rufener K. Allena KatzPatel, DO

## 2014-07-04 ENCOUNTER — Ambulatory Visit: Payer: Self-pay | Admitting: Neurology

## 2014-07-08 ENCOUNTER — Ambulatory Visit (INDEPENDENT_AMBULATORY_CARE_PROVIDER_SITE_OTHER): Payer: Medicare Other | Admitting: Neurology

## 2014-07-08 ENCOUNTER — Encounter: Payer: Self-pay | Admitting: Neurology

## 2014-07-08 VITALS — BP 120/80 | HR 93 | Ht 66.0 in | Wt 168.4 lb

## 2014-07-08 DIAGNOSIS — G9050 Complex regional pain syndrome I, unspecified: Secondary | ICD-10-CM

## 2014-07-08 DIAGNOSIS — G3184 Mild cognitive impairment, so stated: Secondary | ICD-10-CM | POA: Diagnosis not present

## 2014-07-08 NOTE — Progress Notes (Signed)
Follow-up Visit   Date: 07/08/2014    Courtney David MRN: [id number] DOB: [birth year]   Interim History: Courtney David is a 54 y.o. right-handed Caucasian female with history of reflex sympathetic dystrophy with chronic leg pain s/p spinal cord stimulator which was recently removed, TMJ, migraines with vertigo, GERD, chronic R shoulder pain, cervical spondylosis s/p disectomy C3-4, C4-5, C6-7 with fusion (C3-7), CTS s/p release returning to the clinic for follow-up of BPPV and memory problems.    History of present illness: She reports having along history which started in her 4th grade. She would loose her vision and dysarthria, develop nausea/vomiting, and then would have holocephalic headache. She took Midrin as needed. Headaches continued intermittently and in 1997, she developed dizziness without associated headache. Work-up for dizziness was extensive and she continues to have this for prolonged period of time until she was given a trial of lorazepam, which alleviated symptoms. Dizziness is described as room spinning, lasting 30-36min if she has lorazepam. It is exacerbated by abrupt head and eye movements. It is improved by laying down.  Frequency is 3-4 per year. She has associated nausea. She had had meclizine but has not tried vestibular therapy. She fell and broke her nose in 1998. She was under the care of Dr. Meryl Crutch for a long time. She was on effexor , atenolol, lorazepam 0.5mg  as needed. She reports having improvement of symptoms over the years. She was diagnosed with migranous vertigo and was tried on a number of medication including keppra  BID, atenolol, clonidine, verapamil, antivert, flexiril, neurontin, and effexor with little benefit.   She also reports having RSD related to work-related accident. She had spinal cord stimulator placed in 2013 but recently had it removed. For chronic pain, she has has had ketamine infusion for RSD. [name]   She has seen Dr. Kristeen Miss  (ortho), Loretha Stapler [facility]), Barrie Dunker (Pain Management, [place]), Santiago Glad (headaches, Dr. Isaiah Serge, Dr. Jeral Fruit (neurosurgery), and Dr. Lenor Coffin (GI).   UPDATE 01/02/2014: At her last visit, we discuss diagnosis of likely BPPV moreso than migrainous vertigo.  She reports being under a great deal of stress because her mother has CHF and has been given survival of 4-6 months.  She complains of short-term memory problems and also noticed problems in her long-term memory.  This past week, she threw her car keys away in the trash and did not notice it until her roommate found it in the trash.  She is very emotional and started crying when she could not remember her mother's age.  Denies having any problems driving, but says that she had to look up where our office was today because she did not remember it.  No car accidents.  She has had a 3 spells of vertigo which has responded to lorazepam.    UPDATE 07/08/2014:  Topamax was discontinued soon after her neuropsychological testing and she was switched to amitriptyline  qhs.  Her mental fogginess is improved since stopping topamax.  Mood is better since life stressors are improved and her mother is doing better.  She has hobbies and takes care of her aquarium, colors, and does activities.  No new neurological complaints.   Medications:  Current Outpatient Prescriptions on File Prior to Visit  Medication Sig Dispense Refill  . atorvastatin (LIPITOR) 40 MG tablet Take 40 mg by mouth daily.    . Calcium Aspartate 75 MG TABS Take by mouth.    . cloNIDine (CATAPRES) 0.1 MG tablet  Take 0.05 mg by mouth 2 (two) times daily.     . cyclobenzaprine (FLEXERIL) 10 MG tablet Take 1 tablet (10 mg total) by mouth 3 (three) times daily as needed for muscle spasms. 50 tablet 2  . HYDROcodone-acetaminophen (NORCO/VICODIN) 5-325 MG per tablet Take q 8 hours PRN pain.  Will take 0 to 3 per day based on symptoms.    [name] LORazepam (ATIVAN)  0.5 MG tablet Take 0.5 mg by mouth every 8 (eight) hours as needed for anxiety.    [name] omeprazole (PRILOSEC) 40 MG capsule Take 40 mg by mouth daily.      [name] senna (SENOKOT) 8.6 MG TABS tablet Take 1 tablet by mouth 2 (two) times daily.    . tapentadol (NUCYNTA) 50 MG TABS tablet Take 50 mg by mouth 2 (two) times daily as needed.    . venlafaxine (EFFEXOR-XR) 75 MG 24 hr capsule Take 75 mg by mouth daily.      . Vitamin D, Ergocalciferol, (DRISDOL) 50000 UNITS CAPS Take 50,000 Units by mouth once a week.     No current facility-administered medications on file prior to visit.    Allergies:  Allergies  Allergen Reactions  . Codeine Nausea And Vomiting    Nausea, vomiting  . Sulfa Antibiotics     Hives, itching     Review of Systems:  CONSTITUTIONAL: No fevers, chills, night sweats, or weight loss.   EYES: No visual changes or eye pain ENT: No hearing changes.  No history of nose bleeds.   RESPIRATORY: No cough, wheezing and shortness of breath.   CARDIOVASCULAR: Negative for chest pain, and palpitations.   GI: Negative for abdominal discomfort, blood in stools or black stools.  No recent change in bowel habits.   GU:  No history of incontinence.   MUSCLOSKELETAL: No history of joint pain or swelling.  No myalgias.   SKIN: Negative for lesions, rash, and itching.   ENDOCRINE: Negative for cold or heat intolerance, polydipsia or goiter.   PSYCH:  ++ depression or anxiety symptoms.   NEURO: As Above.   Vital Signs:  BP 120/80 mmHg  Pulse 93  Ht  (1.676 m)  Wt 168 lb 6 oz (76.374 kg)  BMI 27.19 kg/m2  SpO2 99%  Neurological Exam: Mental status:  Cheerful affect, She is oriented to month, year, the day, date, place, and person.  CRANIAL NERVES:  Pupils equal round and reactive to light.  Normal conjugate, extra-ocular eye movements in all directions of gaze.  Face is symmetric.   MOTOR:  Motor strength is 5/5 proximal upper extremities with 5-/5 the distal hand muscles.   Right lower extremity is 5/5, left lower extremity is antigravity proximally no movement distally.  Left lower leg and foot is atrophied  MSRs:  Reflexes are 2+/4 throughout, except absent at the Achilles bilaterally.  COORDINATION/GAIT:  She uses bilateral forearm crutches for gait which appears antalgic favoring the right side.    Data: Labs 03/18/2013: Na 144, Cr 0.8, K 4.4, Chl 110, AST 18, ALT 19, LDL 104 TG 161 HDL 42   Cervical myelogram 12/08/2012:  1. C5-C6 ACDF with solid interbody arthrodesis and no hardware loosening. Posterior lower cervical spine epidural space metallic stimulator lead present from the C5 pedicle to the C7 pedicle level. There does appear to be a degree of spinal stenosis at C5 and C6 related in part to endplate osteophytes. AP thecal sac is reduced to 5-6 mm, see series 2, images 54 and 62.  2. 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 Spinal cord mass effect at C4-C5 is present and greater on the right. Thecal sac detail is limited at C6-C7 due to hardware streak artifact, but flattening of the spinal cord there also is suspected. There is multifactorial moderate to severe right C5 foraminal stenosis.  3. Trace spondylolisthesis at C3-C4 with small central disc protrusion. Mild spinal stenosis with minimal mass effect on the  spinal cord. Mild to moderate left C4 foraminal stenosis.   Neuropsychological testing 01/20/2014 at [facility] suggests cognitive disorder due to medication effect from topamax and recommends discontinuing it. If not improvement following d/c topamax and symptoms do not improve, MRI brain can be performed   IMPRESSION/PLAN: 1.  Mild cognitive impairment, improved since stopping topamax.  I do feel that some of her cognitive impairment is mood-related and stressed the importance of effecting coping mechanisms during stressful situations.  Encouraged her to stay active, engage in mental stimulating  activities, start water exercises, and have healthy life style.  2.  BPPV, stable  3.  Return to clinic as needed   The duration of this appointment visit was 20 minutes of face-to-face time with the patient.  Greater than 50% of this time was spent in counseling, explanation of diagnosis, planning of further management, and coordination of care.   Thank you for allowing me to participate in patient's care.  If I can answer any additional questions, I would be pleased to do so.    Sincerely,    Donika K. Allena KatzPatel, DO

## 2014-07-08 NOTE — Patient Instructions (Signed)
Keep up the great work!  Stay active, start a water exercise program, and continue to engage in mentally stimulating activities.

## 2015-05-06 IMAGING — CT CT CERVICAL SPINE W/ CM
3 of 4 series · 12 of 33 positions shown, 14 images · IV contrast (omnipaque)
Comparison: Cervical spine CT without contrast 12/07/2012. Cervical
spine MRI 02/27/2004.

CLINICAL DATA: 51-year-old female with left upper extremity pain,
numbness and weakness. History of cervical spine fusion and cervical
spinal stimulator placement.

EXAM:
CT CERVICAL SPINE WITH CONTRAST
TECHNIQUE: Multidetector CT imaging of the cervical spine was performed during
intravenous contrast administration. Multiplanar CT image
reconstructions were also generated.
CONTRAST:  10 mL Omnipaque 300 intrathecal contrast, see cervical
myelogram PROCEDURE reported separately.

[Series 4: coronal bone · coronal · 0.29mm/px · 3 of 51 slices shown]
[im 11/51  bone]
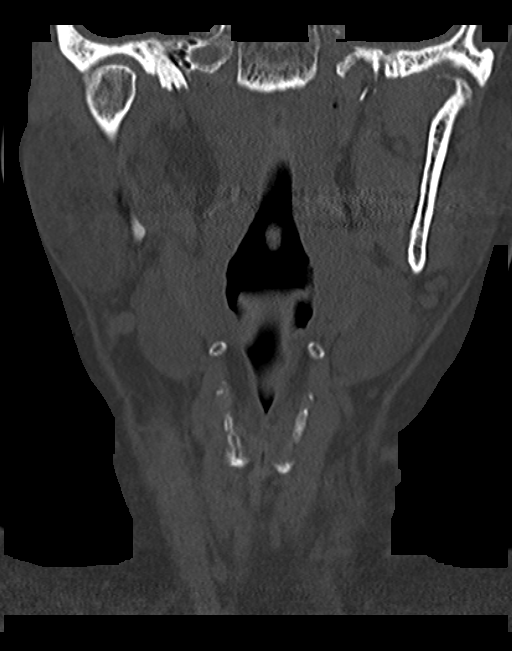
[im 21/51  bone]
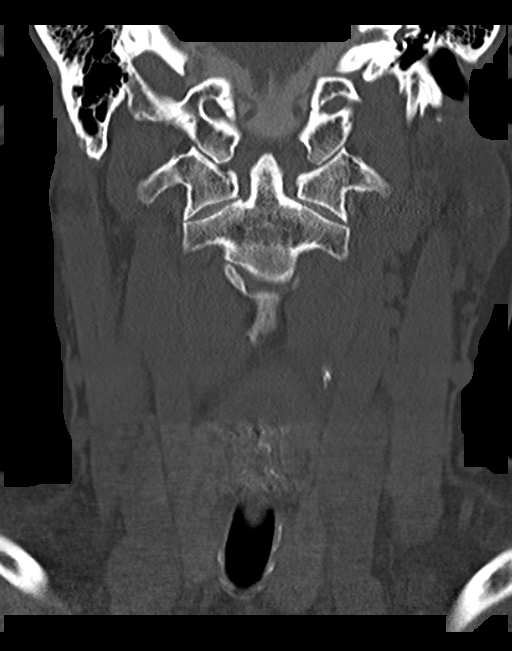
[im 31/51  bone]
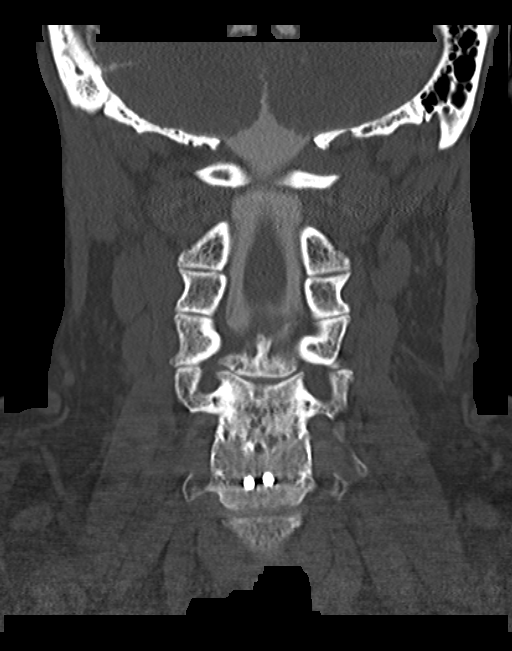

[Series 5: sagittal bone · sagittal · 0.26mm/px · 5 of 43 slices shown, 6 images]
[im 15/43  bone]
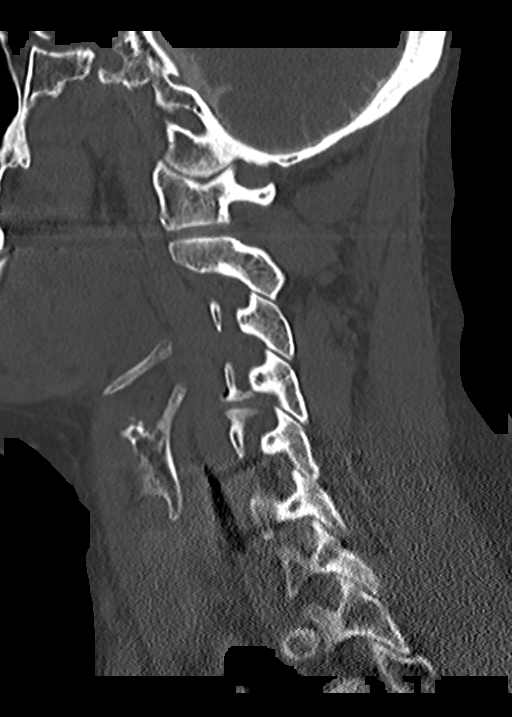
[im 18/43  bone]
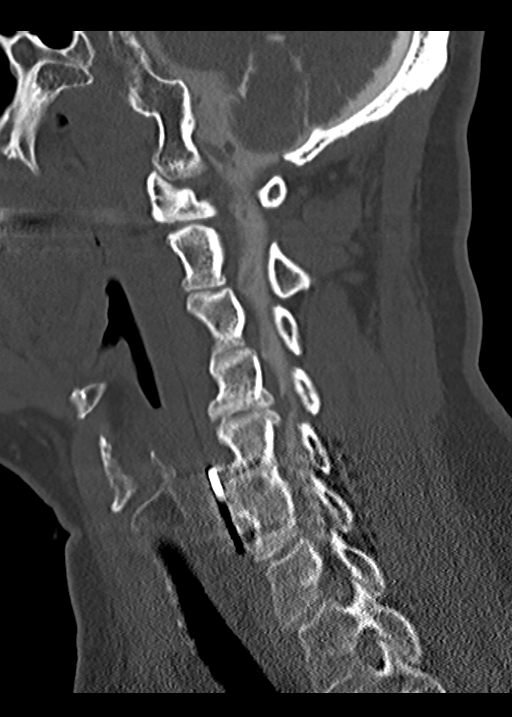
[im 22/43  soft-tissue]
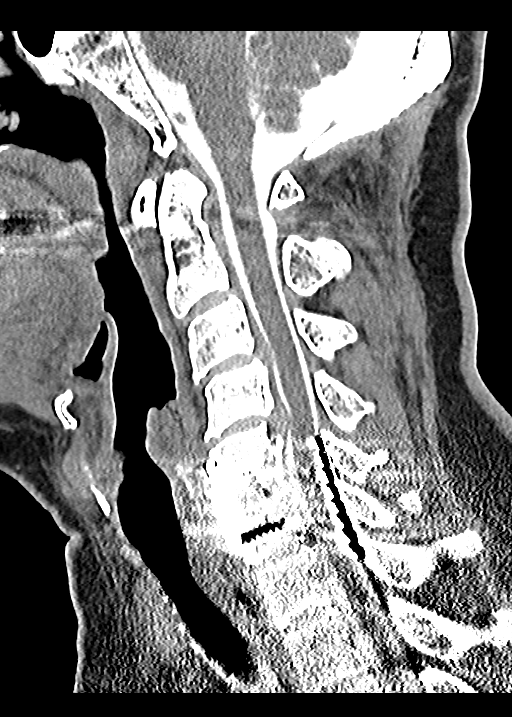
[im 22/43  bone]
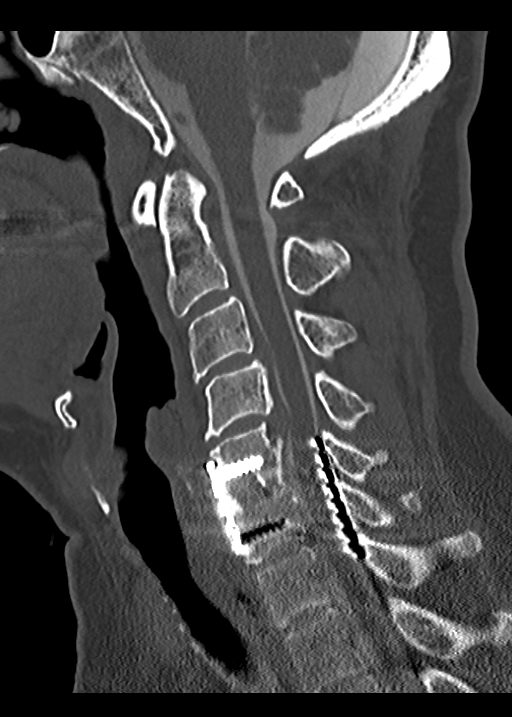
[im 25/43  bone]
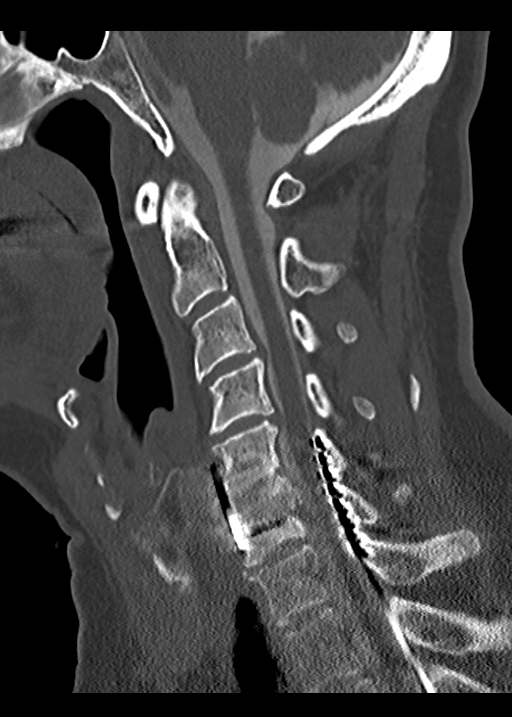
[im 29/43  bone]
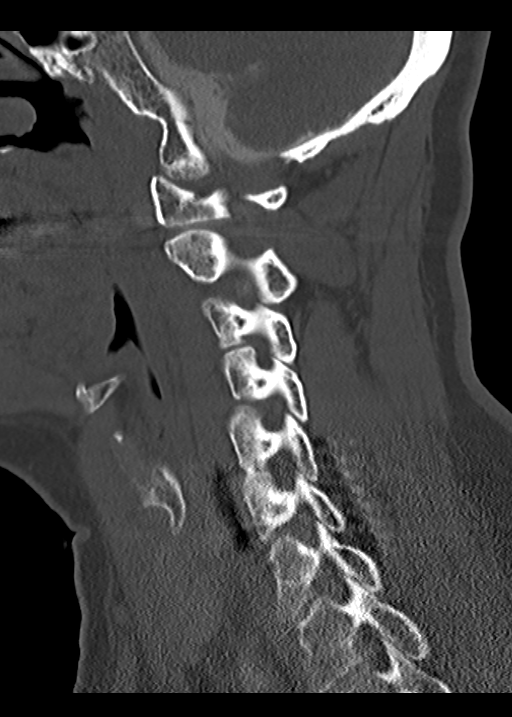

[Series 6: orthogonal bone · axial · 0.23mm/px · z∈[+445,+569]mm · 4 of 95 slices shown, 5 images]
[im 16/95  soft-tissue]
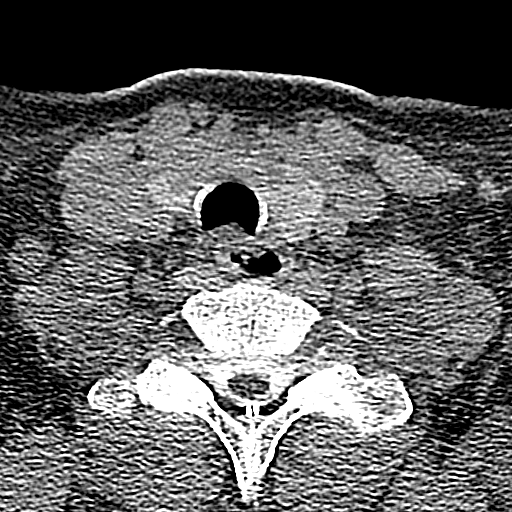
[im 16/95  bone]
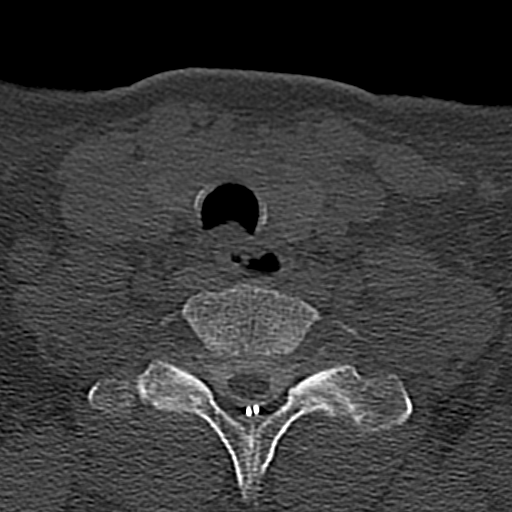
[im 32/95  bone]
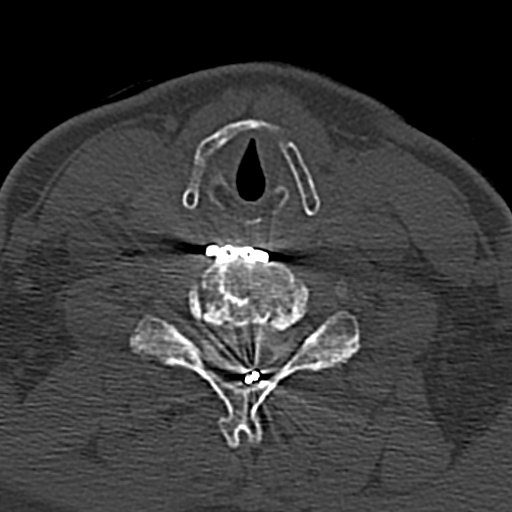
[im 63/95  bone]
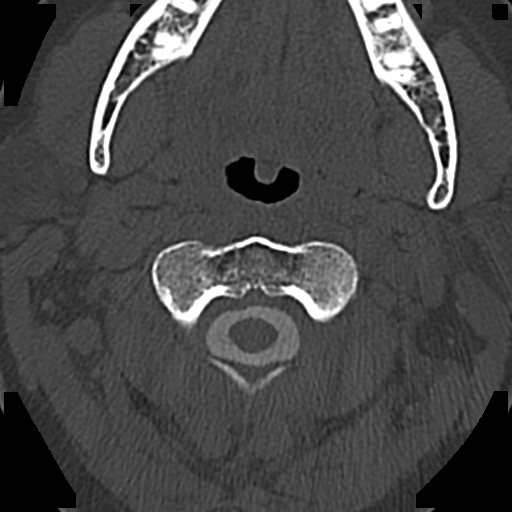
[im 79/95  bone]
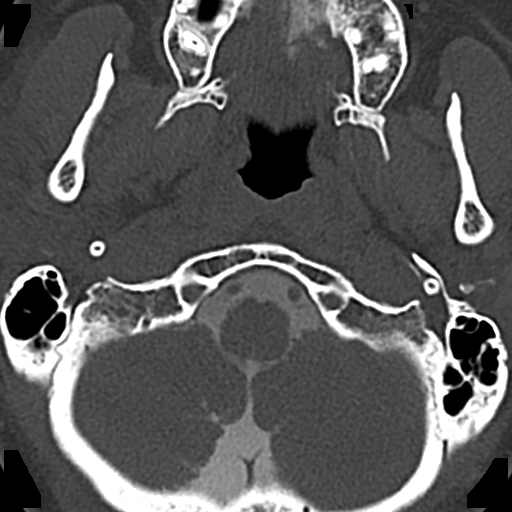

[12 of 33 positions shown; findings below may reference images not displayed]

FINDINGS: Intrathecal and subarachnoid contrast is present. Negative
visualized brain parenchyma. Insert sinuses No acute osseous
abnormality identified. Insert paraspinal sequelae of ACDF and lower
cervical spinal canal spinal stimulator placement, details below.

C2-C3: Negative.

C3-C4: Trace anterolisthesis of C3 on C4. Central disc protrusion.
Mild spinal stenosis with minimal mass effect on the ventral spinal
cord (series 3, image 43). Left uncovertebral hypertrophy. Mild to
moderate left C4 foraminal stenosis.

C4-C5: Circumferential disc osteophyte complex. Right eccentric
broad-based component of disc. Mild ligament flavum hypertrophy.
Spinal stenosis with spinal cord flattening (estimated AP thecal sac
6 mm, series 3 image 52). Right greater than left uncovertebral
hypertrophy. Mild left and moderate to severe right C5 foraminal
stenosis.

C5-C6: ACDF. Hardware intact without evidence of loosening. Solid
interbody arthrodesis. Posteriorly the metal lead portion of the
spinal stimulator device is present in the posterior epidural space,
tip at the level of the C5 cortical screw. Right paracentral C5
osteophytosis (series 3, image 55). Endplate osteophytosis at the
level of the disc. Combined there is narrowing of the AP thecal sac
2 5-6 mm (series 2, image 54 and 62). No foraminal stenosis.

C6-C7: Circumferential disc osteophyte complex. Metal lead portion
of the spinal stimulator device present in the posterior epidural
space at this level, the inferior extent of the lead corresponding
to the C7 pedicle level. From hardware streak artifact, the thecal
sac to talus diminished. There does appear to be multifactorial
spinal stenosis, estimated AP thecal sac reduced to 5 mm. No
foraminal stenosis at this level.

C7-T1: Non metal portion of the spinal stimulator lead in the
posterior epidural space in continuing into the upper thoracic
epidural space. No disc herniation or stenosis.

T1-T2 level: Negative except for posterior epidural space stimulator
lead.
IMPRESSION: 1. C5-C6 ACDF with solid interbody arthrodesis and no hardware
loosening. Posterior lower cervical spine epidural space metallic
stimulator lead present from the C5 pedicle to the C7 pedicle level.
There does appear to be a degree of spinal stenosis at C5 and C6
related in part to endplate osteophytes. AP thecal sac is reduced to
5-6 mm, see series 2, images 54 and 62.

2. Adjacent segment disease at C4-C5 and C6-C7 with spinal stenosis.
Spinal cord mass effect at C4-C5 is present and greater on the
right. Thecal sac detail is limited at C6-C7 due to hardware streak
artifact, but flattening of the spinal cord there also is suspected.
There is multifactorial moderate to severe right C5 foraminal
stenosis.

3. Trace spondylolisthesis at C3-C4 with small central disc
protrusion. Mild spinal stenosis with minimal mass effect on the
spinal cord. Mild to moderate left C4 foraminal stenosis.

## 2015-06-08 DIAGNOSIS — F4323 Adjustment disorder with mixed anxiety and depressed mood: Secondary | ICD-10-CM | POA: Diagnosis not present

## 2015-06-08 DIAGNOSIS — F411 Generalized anxiety disorder: Secondary | ICD-10-CM | POA: Diagnosis not present

## 2015-08-07 DIAGNOSIS — I10 Essential (primary) hypertension: Secondary | ICD-10-CM | POA: Diagnosis not present

## 2015-08-07 DIAGNOSIS — F17200 Nicotine dependence, unspecified, uncomplicated: Secondary | ICD-10-CM | POA: Diagnosis not present

## 2015-08-07 DIAGNOSIS — G90529 Complex regional pain syndrome I of unspecified lower limb: Secondary | ICD-10-CM | POA: Diagnosis not present

## 2015-08-07 DIAGNOSIS — F5104 Psychophysiologic insomnia: Secondary | ICD-10-CM | POA: Diagnosis not present

## 2015-08-07 DIAGNOSIS — F329 Major depressive disorder, single episode, unspecified: Secondary | ICD-10-CM | POA: Diagnosis not present

## 2015-08-07 DIAGNOSIS — Z6826 Body mass index (BMI) 26.0-26.9, adult: Secondary | ICD-10-CM | POA: Diagnosis not present

## 2015-08-13 DIAGNOSIS — F4323 Adjustment disorder with mixed anxiety and depressed mood: Secondary | ICD-10-CM | POA: Diagnosis not present

## 2015-08-13 DIAGNOSIS — F411 Generalized anxiety disorder: Secondary | ICD-10-CM | POA: Diagnosis not present

## 2015-08-17 ENCOUNTER — Encounter: Payer: Self-pay | Admitting: Neurology

## 2015-08-17 ENCOUNTER — Ambulatory Visit (INDEPENDENT_AMBULATORY_CARE_PROVIDER_SITE_OTHER): Payer: Medicare Other | Admitting: Neurology

## 2015-08-17 VITALS — BP 132/80 | HR 78 | Resp 16 | Ht 66.0 in | Wt 175.0 lb

## 2015-08-17 DIAGNOSIS — G478 Other sleep disorders: Secondary | ICD-10-CM | POA: Diagnosis not present

## 2015-08-17 DIAGNOSIS — G4710 Hypersomnia, unspecified: Secondary | ICD-10-CM | POA: Diagnosis not present

## 2015-08-17 DIAGNOSIS — M79662 Pain in left lower leg: Secondary | ICD-10-CM | POA: Diagnosis not present

## 2015-08-17 DIAGNOSIS — M79601 Pain in right arm: Secondary | ICD-10-CM | POA: Diagnosis not present

## 2015-08-17 DIAGNOSIS — G90522 Complex regional pain syndrome I of left lower limb: Secondary | ICD-10-CM | POA: Diagnosis not present

## 2015-08-17 DIAGNOSIS — R351 Nocturia: Secondary | ICD-10-CM

## 2015-08-17 DIAGNOSIS — R0683 Snoring: Secondary | ICD-10-CM | POA: Diagnosis not present

## 2015-08-17 NOTE — Patient Instructions (Signed)

## 2015-08-17 NOTE — Progress Notes (Signed)
Subjective:    Patient ID: Courtney David is a 55 y.o. female.  HPI     Huston Foley, MD, PhD [facility] [address] [address] [place], [place] [address]  Dear Dr. Eloise Harman,   I saw your patient, Courtney David), upon your kind request in my neurologic clinic today for initial consultation of her sleep disorder, in particular, concern for underlying obstructive sleep apnea, in the context of snoring, daytime tiredness reported and difficulty initiating sleep. The patient is unaccompanied today. As you know, Courtney David is a 55 year old right-handed woman with an underlying medical history of TMJ, migraine headaches, reflux disease, history of complex regional pain syndrome affecting left lower extremity with gait instability, memory loss, smoking, and overweight state, status post carpal tunnel surgery on the left, cervical spine fusion surgery, bilateral TMJ surgery, total abdominal hysterectomy and right-sided salpingo-oophorectomy, right knee arthroscopic surgery, left knee arthroscopic surgery, epidural block for left lower extremity pain, sympathetic nerve block for reflex sympathetic dystrophy, and cervical spine surgery (ACDF in October 2014 with hardware in place including plate and cage placement at C3-4, C4-5, and C6-7), who reports snoring, nonrestorative sleep, and daytime tiredness. Her Epworth sleepiness score is 3 out of 24 today, her fatigue score is 34 out of 63. She does not wake up rested. She mostly sleeps alone. She has been living with her partner of 37 years but is not able to share the same bed because of her restless sleep and pain at night. She denies restless leg symptoms or leg twitching, left side bothers her at night. She has nocturia about once per night. She does not have any clear-cut family history of obstructive sleep apnea but older brother snores loudly she says. She has no children. She is on disability. She still smokes about 1 pack per  day but is trying to quit. She drinks tea occasionally, about once a week, no daily caffeine typically. Mother died at the age of 52 with heart disease and she had a sleep study and needed oxygen treatment. Her father is 46 years old. She is currently on multiple medications including Cymbalta, amitriptyline, hydrocodone as needed, usually one half pill at night typically, Flexeril as needed, lorazepam as needed. She takes Lipitor and omeprazole daily. She has been on Ambien as needed, takes it sometimes 3 nights in a row to get on the schedule. She tries to be in bed by 10 PM but may not be asleep until 1 or 2 AM. In addition, she also has trouble maintaining sleep. She sees Dr. Roderic Ovens in [place] for her pain management. I reviewed your office note from 08/07/2015, which you kindly included.  Her Past Medical History Is Significant For: Past Medical History  Diagnosis Date  . RSD (reflex sympathetic dystrophy)     mainly left side/ right hand  . Hypertension     takes Clonidine daily  . Hyperlipidemia     takes Lipitor daily  . Constipation     takes Senokot daily  . Muscle spasms of head and/or neck     takes Flexeril daily prn  . GERD (gastroesophageal reflux disease)     takes Omeprazole daily  . History of migraine     takes Topamax daily  . PONV (postoperative nausea and vomiting)   . RSD (reflex sympathetic dystrophy)   . Short-term memory loss     takes Effexor daily  . Family history of anesthesia complication     pts grandma frontal lobe  died bc of anesthesia  . Vertigo   . Numbness and tingling     left arm  . Arthritis   . Joint pain   . Joint swelling   . History of gout   . Back pain   . Urinary frequency   . TMJ syndrome   . Headache     Her Past Surgical History Is Significant For: Past Surgical History  Procedure Laterality Date  . Tmj arthroplasty    . Abdominal hysterectomy    . Ovary removed    . Neck fusion    . Shoulder surgery      screw  right shoulder  . Wrist surgery      cyst removed from both wrists  . Spinal cord stimulator implant    . Knee arthroscopy      bil  . Nose surgery      d/t broke nose  . Colonoscopy    . Anterior cervical decomp/discectomy fusion N/A 01/09/2013    Procedure: ANTERIOR CERVICAL DECOMPRESSION/DISCECTOMY FUSION CERVICALTHREE-FOUR FOUR-FIVE,FIVE-SIX  REMOVAL OLD PLATE CERVICAL FIVE-SIX PLATING CERVICAL THREE TO CERVICAL SIX- SEVEN;  Surgeon: Karn Cassis, MD;  Location: [facility];  Service: Neurosurgery;  Laterality: N/A;    Her Family History Is Significant For: Family History  Problem Relation Age of Onset  . Colon polyps Paternal Grandfather   . Heart disease Mother   . Diabetes Mother   . Thyroid disease Father   . Diabetes Father     Pre-diabetes  . Migraines Father   . Healthy Brother     Her Social History Is Significant For: Social History   Social History  . Marital Status: Significant Other    Spouse Name: N/A  . Number of Children: 0  . Years of Education: College   Occupational History  . Disabled     Social History Main Topics  . Smoking status: Current Every Day Smoker -- 1.00 packs/day for 30 years    Types: Cigarettes  . Smokeless tobacco: Never Used  . Alcohol Use: No  . Drug Use: No  . Sexual Activity: No   Other Topics Concern  . None   Social History Narrative   She lives with roommate.   She previously worked in [employer] and had an injury on the job.  She has been on disability since 2011.   Drinks 1 tea weekly     Her Allergies Are:  Allergies  Allergen Reactions  . Codeine Nausea And Vomiting    Nausea, vomiting  . Sulfa Antibiotics     Hives, itching  :   Her Current Medications Are:  Outpatient Encounter Prescriptions as of 08/17/2015  Medication Sig  . amitriptyline (ELAVIL) 25 MG tablet   . atorvastatin (LIPITOR) 40 MG tablet Take 40 mg by mouth daily.  . Calcium Aspartate 75 MG TABS Take by mouth.  . cloNIDine  (CATAPRES) 0.1 MG tablet Take 0.05 mg by mouth 2 (two) times daily.   . cyclobenzaprine (FLEXERIL) 10 MG tablet Take 1 tablet (10 mg total) by mouth 3 (three) times daily as needed for muscle spasms.  . DULoxetine (CYMBALTA) 30 MG capsule   . HYDROcodone-acetaminophen (NORCO/VICODIN) 5-325 MG per tablet Take q 8 hours PRN pain.  Will take 0 to 3 per day based on symptoms.  [name] LORazepam (ATIVAN) 0.5 MG tablet Take 0.5 mg by mouth every 8 (eight) hours as needed for anxiety.  [name] omeprazole (PRILOSEC) 40 MG capsule Take 40 mg by mouth  daily.    . senna (SENOKOT) 8.6 MG TABS tablet Take 1 tablet by mouth 2 (two) times daily.  . Vitamin D, Ergocalciferol, (DRISDOL) 50000 UNITS CAPS Take 50,000 Units by mouth once a week.  [name] amitriptyline (ELAVIL) 25 MG tablet Take 25 mg by mouth.  . [DISCONTINUED] venlafaxine (EFFEXOR-XR) 75 MG 24 hr capsule Take 75 mg by mouth daily.     No facility-administered encounter medications on file as of 08/17/2015.  :  Review of Systems:  Out of a complete 14 point review of systems, all are reviewed and negative with the exception of these symptoms as listed below:   Review of Systems  Neurological:       Patient reports trouble falling and staying asleep, snoring, wakes up feeling tired, daytime tiredness, occasionally takes naps.    Epworth Sleepiness Scale 0= would never doze 1= slight chance of dozing 2= moderate chance of dozing 3= high chance of dozing  Sitting and reading:1 Watching TV:1 Sitting inactive in a public place (ex. Theater or meeting):0 As a passenger in a car for an hour without a break:0 Lying down to rest in the afternoon:1 Sitting and talking to someone:0 Sitting quietly after lunch (no alcohol):0 In a car, while stopped in traffic:0 Total:3  Objective:  Neurologic Exam  Physical Exam Physical Examination:   Filed Vitals:   08/17/15 1419  BP: 132/80  Pulse: 78  Resp: 16   General Examination: The patient is a very pleasant  55 y.o. female in no acute distress. She appears well-developed and well-nourished and well groomed.   HEENT: Normocephalic, atraumatic, pupils are equal, round and reactive to light and accommodation. Funduscopic exam is normal with sharp disc margins noted. Extraocular tracking is good without limitation to gaze excursion or nystagmus noted. Normal smooth pursuit is noted. Hearing is grossly intact. Tympanic membranes are clear bilaterally. Face is symmetric with normal facial animation and normal facial sensation. Speech is clear with no dysarthria noted. There is no hypophonia. There is no lip, neck/head, jaw or voice tremor. Neck is supple with full range of passive and active motion. There are no carotid bruits on auscultation. Oropharynx exam reveals: mild mouth dryness, mild pharyngeal erythema,  good dental hygiene and mild airway crowding, due to smaller airway entry. Mallampati is class I. Tongue protrudes centrally and palate elevates symmetrically. Tonsils are small b/l. Neck size is 15.5 inches. She has a minimal overbite. Nasal inspection reveals no significant nasal mucosal bogginess or redness and no septal deviation.   Chest: Clear to auscultation without wheezing, rhonchi or crackles noted.  Heart: S1+S2+0, regular and normal without murmurs, rubs or gallops noted.   Abdomen: Soft, non-tender and non-distended with normal bowel sounds appreciated on auscultation.  Extremities: There is no pitting edema in the distal lower extremities bilaterally. Pedal pulses are intact.  Skin: Warm and dry without trophic changes noted on the R, colder toes and forefoot and discoloration of L foot and distal leg, mildly thinned foot. There are no varicose veins.  Musculoskeletal: exam reveals no obvious joint deformities, tenderness or joint swelling or erythema.   Neurologically:  Mental status: The patient is awake, alert and oriented in all 4 spheres. Her immediate and remote memory,  attention, language skills and fund of knowledge are appropriate. There is no evidence of aphasia, agnosia, apraxia or anomia. Speech is clear with normal prosody and enunciation. Thought process is linear. Mood is normal and affect is normal.  Cranial nerves II - XII are as  described above under HEENT exam. In addition: shoulder shrug is normal with equal shoulder height noted. Motor exam: Normal bulk, strength and tone is noted on the right side. She has mild weakness on the left side in the upper and lower extremity, some limitation secondary to pain it appears. There is no drift, tremor or rebound. Romberg is not testable for safety. Reflexes are 1-2+ throughout. Babinski: Toes are flexor bilaterally. Fine motor skills and coordination: intact with normal finger taps, normal hand movements, normal rapid alternating patting, normal foot taps and normal foot agility, with the exception of decreased fine motor skills.  Cerebellar testing: No dysmetria or intention tremor on finger to nose testing. Heel to shin is unremarkable on the right. There is no truncal or gait ataxia.  Sensory exam: intact to light touch, pinprick, vibration, temperature sense in the upper and lower extremities with the exception of decrease sensation to all modalities and increase PP sensation in the distal L leg up to the knee.  Gait, station and balance: She stands with difficulty.  She relies on her crutches bilaterally. Tandem walk is not possible. She has a mild limp on the left.  Assessment and Plan:   In summary, Courtney David is a very pleasant 55 y.o.-year old female ith an underlying medical history of TMJ, migraine headaches, reflux disease, history of complex regional pain syndrome affecting left lower extremity with gait instability, memory loss, smoking, and overweight state, status post carpal tunnel surgery on the left, cervical spine fusion surgery, bilateral TMJ surgery, total abdominal hysterectomy and  right-sided salpingo-oophorectomy, right knee arthroscopic surgery, left knee arthroscopic surgery, epidural block for left lower extremity pain, sympathetic nerve block for reflex sympathetic dystrophy, and cervical spine surgery ([facility]DF in October 2014 with hardware in place including plate and cage placement at C3-4, C4-5, and C6-7), whose history and physical exam are concerning for obstructive sleep apnea (OSA). I had a long chat with the patient about my findings and the diagnosis of OSA, its prognosis and treatment options. We talked about medical treatments, surgical interventions and non-pharmacological approaches. I explained in particular the risks and ramifications of untreated moderate to severe OSA, especially with respect to developing cardiovascular disease down the Road, including congestive heart failure, difficult to treat hypertension, cardiac arrhythmias, or stroke. Even type 2 diabetes has, in part, been linked to untreated OSA. Symptoms of untreated OSA include daytime sleepiness, memory problems, mood irritability and mood disorder such as depression and anxiety, lack of energy, as well as recurrent headaches, especially morning headaches. We talked about smoking cessation and trying to maintain a healthy lifestyle in general, as well as the importance of weight control. I encouraged the patient to eat healthy, exercise daily and keep well hydrated, to keep a scheduled bedtime and wake time routine, to not skip any meals and eat healthy snacks in between meals. I advised the patient not to drive when feeling sleepy. I recommended the following at this time: sleep study with potential positive airway pressure titration. (We will score hypopneas at 4% and split the sleep study into diagnostic and treatment portion, if the estimated. 2 hour AHI is >15/h). She is advised that our main goal for the sleep study is to look for an underlying organic sleep disorder such as sleep disordered breathing  as a cause for her sleep problems. It does sound like she is bothered quite a bit by pain at night. I will also be on the lookout for PLMD.   I  explained the sleep test procedure to the patient and also outlined possible surgical and non-surgical treatment options of OSA, including the use of a custom-made dental device (which would require a referral to a specialist dentist or oral surgeon), upper airway surgical options, such as pillar implants, radiofrequency surgery, tongue base surgery, and UPPP (which would involve a referral to an ENT surgeon). Rarely, jaw surgery such as mandibular advancement may be considered.  I also explained the CPAP treatment option to the patient, who indicated that she would be willing to try CPAP if the need arises. I explained the importance of being compliant with PAP treatment, not only for insurance purposes but primarily to improve Her symptoms, and for the patient's long term health benefit, including to reduce Her cardiovascular risks. I answered all her questions today and the patient was in agreement. I would like to see her back after the sleep study is completed and encouraged her to call with any interim questions, concerns, problems or updates.   Thank you very much for allowing me to participate in the care of this nice patient. If I can be of any further assistance to you please do not hesitate to call me at [phone].  Sincerely,   Huston FoleySaima Mael Delap, MD, PhD

## 2016-05-02 DIAGNOSIS — J111 Influenza due to unidentified influenza virus with other respiratory manifestations: Secondary | ICD-10-CM | POA: Diagnosis not present

## 2016-05-02 DIAGNOSIS — R509 Fever, unspecified: Secondary | ICD-10-CM | POA: Diagnosis not present

## 2016-05-02 DIAGNOSIS — R8299 Other abnormal findings in urine: Secondary | ICD-10-CM | POA: Diagnosis not present

## 2016-05-02 DIAGNOSIS — R52 Pain, unspecified: Secondary | ICD-10-CM | POA: Diagnosis not present

## 2016-05-02 DIAGNOSIS — E784 Other hyperlipidemia: Secondary | ICD-10-CM | POA: Diagnosis not present

## 2016-05-02 DIAGNOSIS — R05 Cough: Secondary | ICD-10-CM | POA: Diagnosis not present

## 2016-05-02 DIAGNOSIS — Z6827 Body mass index (BMI) 27.0-27.9, adult: Secondary | ICD-10-CM | POA: Diagnosis not present

## 2016-05-02 DIAGNOSIS — I10 Essential (primary) hypertension: Secondary | ICD-10-CM | POA: Diagnosis not present

## 2016-05-02 DIAGNOSIS — R7301 Impaired fasting glucose: Secondary | ICD-10-CM | POA: Diagnosis not present

## 2016-05-09 DIAGNOSIS — Z6827 Body mass index (BMI) 27.0-27.9, adult: Secondary | ICD-10-CM | POA: Diagnosis not present

## 2016-05-09 DIAGNOSIS — J111 Influenza due to unidentified influenza virus with other respiratory manifestations: Secondary | ICD-10-CM | POA: Diagnosis not present

## 2016-05-09 DIAGNOSIS — R509 Fever, unspecified: Secondary | ICD-10-CM | POA: Diagnosis not present

## 2016-05-09 DIAGNOSIS — Z0000 Encounter for general adult medical examination without abnormal findings: Secondary | ICD-10-CM | POA: Diagnosis not present

## 2016-05-09 DIAGNOSIS — E784 Other hyperlipidemia: Secondary | ICD-10-CM | POA: Diagnosis not present

## 2016-05-09 DIAGNOSIS — G90529 Complex regional pain syndrome I of unspecified lower limb: Secondary | ICD-10-CM | POA: Diagnosis not present

## 2016-05-09 DIAGNOSIS — R7301 Impaired fasting glucose: Secondary | ICD-10-CM | POA: Diagnosis not present

## 2016-05-09 DIAGNOSIS — F329 Major depressive disorder, single episode, unspecified: Secondary | ICD-10-CM | POA: Diagnosis not present

## 2016-05-09 DIAGNOSIS — Z1389 Encounter for screening for other disorder: Secondary | ICD-10-CM | POA: Diagnosis not present

## 2016-05-09 DIAGNOSIS — F17200 Nicotine dependence, unspecified, uncomplicated: Secondary | ICD-10-CM | POA: Diagnosis not present

## 2016-05-17 DIAGNOSIS — Z1231 Encounter for screening mammogram for malignant neoplasm of breast: Secondary | ICD-10-CM | POA: Diagnosis not present

## 2016-09-18 ENCOUNTER — Encounter (HOSPITAL_COMMUNITY): Payer: Self-pay | Admitting: Emergency Medicine

## 2016-09-18 ENCOUNTER — Emergency Department (HOSPITAL_COMMUNITY)
Admission: EM | Admit: 2016-09-18 | Discharge: 2016-09-18 | Disposition: A | Discharge status: Home / Self Care | Payer: Medicare Other | Attending: Emergency Medicine | Admitting: Emergency Medicine

## 2016-09-18 DIAGNOSIS — F17210 Nicotine dependence, cigarettes, uncomplicated: Secondary | ICD-10-CM | POA: Diagnosis not present

## 2016-09-18 DIAGNOSIS — H748X2 Other specified disorders of left middle ear and mastoid: Secondary | ICD-10-CM | POA: Diagnosis not present

## 2016-09-18 DIAGNOSIS — Z79899 Other long term (current) drug therapy: Secondary | ICD-10-CM | POA: Insufficient documentation

## 2016-09-18 DIAGNOSIS — R42 Dizziness and giddiness: Secondary | ICD-10-CM

## 2016-09-18 DIAGNOSIS — I10 Essential (primary) hypertension: Secondary | ICD-10-CM | POA: Diagnosis not present

## 2016-09-18 DIAGNOSIS — G43C0 Periodic headache syndromes in child or adult, not intractable: Secondary | ICD-10-CM

## 2016-09-18 LAB — I-STAT CHEM 8, ED
BUN: 14 mg/dL (ref 6–20)
Calcium, Ion: 1.09 mmol/L — ABNORMAL LOW (ref 1.15–1.40)
Chloride: 107 mmol/L (ref 101–111)
Creatinine, Ser: 0.7 mg/dL (ref 0.44–1.00)
GLUCOSE: 108 mg/dL — AB (ref 65–99)
HCT: 49 % — ABNORMAL HIGH (ref 36.0–46.0)
HEMOGLOBIN: 16.7 g/dL — AB (ref 12.0–15.0)
Potassium: 4.4 mmol/L (ref 3.5–5.1)
Sodium: 141 mmol/L (ref 135–145)
TCO2: 27 mmol/L (ref 0–100)

## 2016-09-18 MED ORDER — ONDANSETRON 8 MG PO TBDP
8.0000 mg | ORAL_TABLET | Freq: Once | ORAL | Status: AC
  Administered 2016-09-18: 8 mg via ORAL
  Filled 2016-09-18: qty 1

## 2016-09-18 MED ORDER — DEXAMETHASONE SODIUM PHOSPHATE 10 MG/ML IJ SOLN
10.0000 mg | Freq: Once | INTRAMUSCULAR | Status: AC
  Administered 2016-09-18: 10 mg via INTRAVENOUS
  Filled 2016-09-18: qty 1

## 2016-09-18 MED ORDER — PROCHLORPERAZINE EDISYLATE 5 MG/ML IJ SOLN
10.0000 mg | Freq: Once | INTRAMUSCULAR | Status: AC
  Administered 2016-09-18: 10 mg via INTRAVENOUS
  Filled 2016-09-18: qty 2

## 2016-09-18 MED ORDER — SODIUM CHLORIDE 0.9 % IV BOLUS (SEPSIS)
500.0000 mL | Freq: Once | INTRAVENOUS | Status: AC
  Administered 2016-09-18: 500 mL via INTRAVENOUS

## 2016-09-18 MED ORDER — MECLIZINE HCL 25 MG PO TABS
25.0000 mg | ORAL_TABLET | Freq: Once | ORAL | Status: AC
  Administered 2016-09-18: 25 mg via ORAL
  Filled 2016-09-18: qty 1

## 2016-09-18 MED ORDER — MECLIZINE HCL 25 MG PO TABS: 25.0000 mg | ORAL_TABLET | Freq: Three times a day (TID) | ORAL | 0 refills | Status: AC | PRN

## 2016-09-18 MED ORDER — DIPHENHYDRAMINE HCL 50 MG/ML IJ SOLN
25.0000 mg | Freq: Once | INTRAMUSCULAR | Status: AC
  Administered 2016-09-18: 25 mg via INTRAVENOUS
  Filled 2016-09-18: qty 1

## 2016-09-18 NOTE — ED Triage Notes (Signed)
Pt from home with complaints of dizziness since Thursday. Pt sates she has hx of migraines with vertigo. Pt states initially she thought it was sinus pressure. Pt thinks this feels similar to her normal vertigo/migraine pain, but more intense than usual

## 2016-09-18 NOTE — ED Notes (Signed)
Bed: WTR6 Expected date:  Expected time:  Means of arrival:  Comments:   Rosezetta SchlatterHamby, Layah Skousen J, RN 09/18/16 1547

## 2016-09-18 NOTE — ED Provider Notes (Signed)
[facility]-EMERGENCY DEPT Provider Note   CSN: [id number] Arrival date & time: 09/18/16  1531     History   Chief Complaint Chief Complaint  Patient presents with  . Dizziness    HPI Courtney David is a 56 y.o. female.  HPI 56 year old female with a history of migraines, vertigo who presents the ED with 5 days of worsening vertiginous symptoms in 2 days of gradually worsening typical migraine headaches for the patient.   Vertiginous symptoms are exacerbated with opening her eyes and head movement. They report that they had cleaned out her ears yesterday which significantly worsened her vertiginous symptoms. Improved by closing her eyes and lying still. Reports associated nausea. No emesis, no vision changes of the chest pain, shortness of breath, palpitations, recent trauma. She does report that she's been having 2 weeks of left otalgia. No known physical or barotrauma. No recent fevers or infections.  Migraine headache is exacerbated with loud sounds and light. No reported alleviating factors at this time. Headache is typical for her prior migraine presentations.  Denies any other physical complaints. Past Medical History:  Diagnosis Date  . Arthritis   . Back pain   . Constipation    takes Senokot daily  . Family history of anesthesia complication    pts grandma frontal lobe died bc of anesthesia  . GERD (gastroesophageal reflux disease)    takes Omeprazole daily  . Headache   . History of gout   . History of migraine    takes Topamax daily  . Hyperlipidemia    takes Lipitor daily  . Hypertension    takes Clonidine daily  . Joint pain   . Joint swelling   . Muscle spasms of head and/or neck    takes Flexeril daily prn  . Numbness and tingling    left arm  . PONV (postoperative nausea and vomiting)   . RSD (reflex sympathetic dystrophy)    mainly left side/ right hand  . RSD (reflex sympathetic dystrophy)   . Short-term memory loss    takes Effexor daily  . TMJ  syndrome   . Urinary frequency   . Vertigo     Patient Active Problem List   Diagnosis Date Noted  . RSD (reflex sympathetic dystrophy) 12/09/2012  . Tobacco dependence 12/09/2012  . Weakness of left upper extremity 12/07/2012  . Hypertension 12/07/2012  . Hyperlipidemia 12/07/2012    Past Surgical History:  Procedure Laterality Date  . ABDOMINAL HYSTERECTOMY    . ANTERIOR CERVICAL DECOMP/DISCECTOMY FUSION N/A 01/09/2013   Procedure: ANTERIOR CERVICAL DECOMPRESSION/DISCECTOMY FUSION CERVICALTHREE-FOUR FOUR-FIVE,FIVE-SIX  REMOVAL OLD PLATE CERVICAL FIVE-SIX PLATING CERVICAL THREE TO CERVICAL SIX- SEVEN;  Surgeon: Karn Cassis, MD;  Location: [facility];  Service: Neurosurgery;  Laterality: N/A;  . COLONOSCOPY    . KNEE ARTHROSCOPY     bil  . Neck fusion    . NOSE SURGERY     d/t broke nose  . ovary removed    . SHOULDER SURGERY     screw right shoulder  . SPINAL CORD STIMULATOR IMPLANT    . TMJ ARTHROPLASTY    . WRIST SURGERY     cyst removed from both wrists    OB History    No data available       Home Medications    Prior to Admission medications   Medication Sig Start Date End Date Taking? Authorizing Provider  amitriptyline (ELAVIL) 25 MG tablet Take 25 mg by mouth at bedtime.  04/02/14  Yes [provider]  atorvastatin (LIPITOR) 40 MG tablet Take 40 mg by mouth daily.   Yes [provider]  cloNIDine (CATAPRES) 0.1 MG tablet Take 0.05 mg by mouth 2 (two) times daily.    Yes [provider]  lidocaine (XYLOCAINE) 5 % ointment Apply 1 application topically as needed. 09/08/16 10/08/16 Yes [provider]  LORazepam (ATIVAN) 0.5 MG tablet Take 0.5 mg by mouth every 8 (eight) hours as needed for anxiety.   Yes [provider]  omeprazole (PRILOSEC) 40 MG capsule Take 40 mg by mouth daily.     Yes [provider]  Pseudoephedrine-Ibuprofen 30-200 MG CAPS Take 1 tablet by mouth daily as needed for congestion.    Yes [provider]  venlafaxine XR (EFFEXOR-XR) 75 MG 24 hr capsule Take 75 mg by mouth daily.   Yes [provider]  cyclobenzaprine (FLEXERIL) 10 MG tablet Take 1 tablet (10 mg total) by mouth 3 (three) times daily as needed for muscle spasms. Patient not taking: Reported on 09/18/2016 01/12/13   Colon Branch, MD  meclizine (ANTIVERT) 25 MG tablet Take 1 tablet (25 mg total) by mouth 3 (three) times daily as needed for dizziness. 09/18/16   Nira Conn, MD    Family History Family History  Problem Relation Age of Onset  . Heart disease Mother   . Diabetes Mother   . Thyroid disease Father   . Diabetes Father        Pre-diabetes  . Migraines Father   . Colon polyps Paternal Grandfather   . Healthy Brother     Social History Social History  Substance Use Topics  . Smoking status: Current Every Day Smoker    Packs/day: 1.00    Years: 30.00    Types: Cigarettes  . Smokeless tobacco: Never Used  . Alcohol use No     Allergies   Codeine and Sulfa antibiotics   Review of Systems Review of Systems All other systems are reviewed and are negative for acute change except as noted in the HPI   Physical Exam Updated Vital Signs BP (!) 149/75 (BP Location: Right Arm)   Pulse 66   Temp 98.4 F (36.9 C) (Oral)   Resp (!) 24   Ht 5\' 6"  (1.676 m)   Wt 77.1 kg (170 lb)   SpO2 100%   BMI 27.44 kg/m   Physical Exam  Constitutional: She is oriented to person, place, and time. She appears well-developed and well-nourished. No distress.  HENT:  Head: Normocephalic and atraumatic.  Right Ear: No swelling or tenderness. No foreign bodies. No mastoid tenderness. Tympanic membrane is not erythematous and not bulging. No middle ear effusion. No hemotympanum.  Left Ear: There is tenderness. No swelling. No foreign bodies. No mastoid tenderness. Tympanic membrane is not erythematous and not bulging.  No middle ear effusion. There is hemotympanum.  Nose: Nose  normal.  Eyes: Conjunctivae and EOM are normal. Pupils are equal, round, and reactive to light. Right eye exhibits no discharge. Left eye exhibits no discharge. No scleral icterus.  Neck: Normal range of motion. Neck supple.  Cardiovascular: Normal rate and regular rhythm.  Exam reveals no gallop and no friction rub.   No murmur heard. Pulmonary/Chest: Effort normal and breath sounds normal. No stridor. No respiratory distress. She has no rales.  Abdominal: Soft. She exhibits no distension. There is no tenderness.  Musculoskeletal: She exhibits no edema or tenderness.  Neurological: She is alert and oriented to person, place,  and time.  Mental Status: Alert and oriented to person, place, and time. Attention and concentration normal. Speech clear. Recent memory is intact.  Cranial Nerves  II Visual Fields: Intact to confrontation. Visual fields intact. III, IV, VI: Pupils equal and reactive to light and near. Full eye movement without nystagmus. Exacerbates vertigo. V Facial Sensation: Normal. No weakness of masticatory muscles  VII: No facial weakness or asymmetry  VIII Auditory Acuity: Grossly normal  IX/X: The uvula is midline; the palate elevates symmetrically  XI: Normal sternocleidomastoid and trapezius strength  XII: The tongue is midline. No atrophy or fasciculations.   Motor System: Muscle Strength: 5/5 and symmetric in the upper and lower extremities. No pronation or drift.  Muscle Tone: Increased tone in the left lower extremity (baseline per patient). Tone and muscle bulk are normal in  Other upper and lower extremities.   Reflexes: DTRs: 1+ and symmetrical in all four extremities. Plantar responses are flexor bilaterally.  Coordination: Intact finger-to-nose, heel-to-shin. No tremor.  Sensation: Increased sensitivity in the left lower extremity (baseline per patient). Intact to light touch in other extremities.  Gait: Deferred  HINTS: Nystagmus: none Head impulse. Negative;  but exacerbates vertigo Skew: normal    Skin: Skin is warm and dry. No rash noted. She is not diaphoretic. No erythema.  Psychiatric: She has a normal mood and affect.  Vitals reviewed.    ED Treatments / Results  Labs (all labs ordered are listed, but only abnormal results are displayed) Labs Reviewed  I-STAT CHEM 8, ED - Abnormal; Notable for the following:       Result Value   Glucose, Bld 108 (*)    Calcium, Ion 1.09 (*)    Hemoglobin 16.7 (*)    HCT 49.0 (*)    All other components within normal limits    EKG  EKG Interpretation None       Radiology No results found.  Procedures Procedures (including critical care time)  Medications Ordered in ED Medications  ondansetron (ZOFRAN-ODT) disintegrating tablet 8 mg (8 mg Oral Given 09/18/16 1620)  diphenhydrAMINE (BENADRYL) injection 25 mg (25 mg Intravenous Given 09/18/16 1631)  dexamethasone (DECADRON) injection 10 mg (10 mg Intravenous Given 09/18/16 1631)  prochlorperazine (COMPAZINE) injection 10 mg (10 mg Intravenous Given 09/18/16 1632)  meclizine (ANTIVERT) tablet 25 mg (25 mg Oral Given 09/18/16 1632)  sodium chloride 0.9 % bolus 500 mL (500 mLs Intravenous New Bag/Given 09/18/16 1631)     Initial Impression / Assessment and Plan / ED Course  I have reviewed the triage vital signs and the nursing notes.  Pertinent labs & imaging results that were available during my care of the patient were reviewed by me and considered in my medical decision making (see chart for details).     Typical migraine headache for the pt. Non focal neuro exam. No recent head trauma. No fever. Doubt meningitis. Doubt intracranial bleed. Doubt IIH. No indication for imaging.   Will treat with migraine cocktail and reevaluate. Will also provide with antivert.   5:54 PM Significant improvement in patient's symptomatology. Labs without any electrolyte derangements that would be causing her symptoms.  We'll have patient follow-up  with ENT regarding her hemotympanum.  The patient is safe for discharge with strict return precautions.    Final Clinical Impressions(s) / ED Diagnoses   Final diagnoses:  Vertigo  Periodic headache syndrome, not intractable  Hemotympanum, left   Disposition: Discharge  Condition: Good  I have discussed the results, Dx and Tx  plan with the patient who expressed understanding and agree(s) with the plan. Discharge instructions discussed at great length. The patient was given strict return precautions who verbalized understanding of the instructions. No further questions at time of discharge.    New Prescriptions   MECLIZINE (ANTIVERT) 25 MG TABLET    Take 1 tablet (25 mg total) by mouth 3 (three) times daily as needed for dizziness.    Follow Up: Suzanna ObeyByers, John, MD [address] [place] [address] [phone]  Call  For close follow up to assess for left hemotympanum  Jarome MatinPaterson, Daniel, MD [address] [place] [id number] [phone]  Schedule an appointment as soon as possible for a visit  As needed      Mashayla Lavin, Amadeo GarnetPedro Eduardo, MD 09/18/16 1759

## 2017-02-16 DIAGNOSIS — G90512 Complex regional pain syndrome I of left upper limb: Secondary | ICD-10-CM | POA: Diagnosis not present

## 2017-05-04 DIAGNOSIS — R7301 Impaired fasting glucose: Secondary | ICD-10-CM | POA: Diagnosis not present

## 2017-05-04 DIAGNOSIS — I10 Essential (primary) hypertension: Secondary | ICD-10-CM | POA: Diagnosis not present

## 2017-05-04 DIAGNOSIS — E7849 Other hyperlipidemia: Secondary | ICD-10-CM | POA: Diagnosis not present

## 2017-05-11 DIAGNOSIS — F5104 Psychophysiologic insomnia: Secondary | ICD-10-CM | POA: Diagnosis not present

## 2017-05-11 DIAGNOSIS — Z0000 Encounter for general adult medical examination without abnormal findings: Secondary | ICD-10-CM | POA: Diagnosis not present

## 2017-05-11 DIAGNOSIS — E7849 Other hyperlipidemia: Secondary | ICD-10-CM | POA: Diagnosis not present

## 2017-05-11 DIAGNOSIS — G90529 Complex regional pain syndrome I of unspecified lower limb: Secondary | ICD-10-CM | POA: Diagnosis not present

## 2017-05-15 DIAGNOSIS — Z13820 Encounter for screening for osteoporosis: Secondary | ICD-10-CM | POA: Diagnosis not present

## 2018-05-07 DIAGNOSIS — R7301 Impaired fasting glucose: Secondary | ICD-10-CM | POA: Diagnosis not present

## 2018-05-07 DIAGNOSIS — E7849 Other hyperlipidemia: Secondary | ICD-10-CM | POA: Diagnosis not present

## 2018-05-07 DIAGNOSIS — I10 Essential (primary) hypertension: Secondary | ICD-10-CM | POA: Diagnosis not present

## 2018-05-07 DIAGNOSIS — R82998 Other abnormal findings in urine: Secondary | ICD-10-CM | POA: Diagnosis not present

## 2018-05-14 DIAGNOSIS — F17200 Nicotine dependence, unspecified, uncomplicated: Secondary | ICD-10-CM | POA: Diagnosis not present

## 2018-05-14 DIAGNOSIS — I10 Essential (primary) hypertension: Secondary | ICD-10-CM | POA: Diagnosis not present

## 2018-05-14 DIAGNOSIS — F3289 Other specified depressive episodes: Secondary | ICD-10-CM | POA: Diagnosis not present

## 2018-05-14 DIAGNOSIS — Z0000 Encounter for general adult medical examination without abnormal findings: Secondary | ICD-10-CM | POA: Diagnosis not present

## 2018-06-14 DIAGNOSIS — Z1231 Encounter for screening mammogram for malignant neoplasm of breast: Secondary | ICD-10-CM | POA: Diagnosis not present

## 2019-04-08 DIAGNOSIS — H1131 Conjunctival hemorrhage, right eye: Secondary | ICD-10-CM | POA: Diagnosis not present

## 2019-04-25 DIAGNOSIS — H5213 Myopia, bilateral: Secondary | ICD-10-CM | POA: Diagnosis not present

## 2019-05-13 DIAGNOSIS — E7849 Other hyperlipidemia: Secondary | ICD-10-CM | POA: Diagnosis not present

## 2019-05-13 DIAGNOSIS — R7301 Impaired fasting glucose: Secondary | ICD-10-CM | POA: Diagnosis not present

## 2019-05-20 DIAGNOSIS — F17200 Nicotine dependence, unspecified, uncomplicated: Secondary | ICD-10-CM | POA: Diagnosis not present

## 2019-05-20 DIAGNOSIS — F329 Major depressive disorder, single episode, unspecified: Secondary | ICD-10-CM | POA: Diagnosis not present

## 2019-05-20 DIAGNOSIS — R7301 Impaired fasting glucose: Secondary | ICD-10-CM | POA: Diagnosis not present

## 2019-05-20 DIAGNOSIS — Z0000 Encounter for general adult medical examination without abnormal findings: Secondary | ICD-10-CM | POA: Diagnosis not present

## 2019-11-18 DIAGNOSIS — E785 Hyperlipidemia, unspecified: Secondary | ICD-10-CM | POA: Diagnosis not present

## 2019-11-18 DIAGNOSIS — I10 Essential (primary) hypertension: Secondary | ICD-10-CM | POA: Diagnosis not present

## 2019-11-18 DIAGNOSIS — F17200 Nicotine dependence, unspecified, uncomplicated: Secondary | ICD-10-CM | POA: Diagnosis not present

## 2019-11-18 DIAGNOSIS — G90529 Complex regional pain syndrome I of unspecified lower limb: Secondary | ICD-10-CM | POA: Diagnosis not present

## 2020-05-19 DIAGNOSIS — E785 Hyperlipidemia, unspecified: Secondary | ICD-10-CM | POA: Diagnosis not present

## 2020-05-19 DIAGNOSIS — R7301 Impaired fasting glucose: Secondary | ICD-10-CM | POA: Diagnosis not present

## 2020-05-25 DIAGNOSIS — R7301 Impaired fasting glucose: Secondary | ICD-10-CM | POA: Diagnosis not present

## 2020-05-25 DIAGNOSIS — E785 Hyperlipidemia, unspecified: Secondary | ICD-10-CM | POA: Diagnosis not present

## 2020-05-25 DIAGNOSIS — F17200 Nicotine dependence, unspecified, uncomplicated: Secondary | ICD-10-CM | POA: Diagnosis not present

## 2020-05-25 DIAGNOSIS — Z0000 Encounter for general adult medical examination without abnormal findings: Secondary | ICD-10-CM | POA: Diagnosis not present

## 2020-05-25 DIAGNOSIS — I10 Essential (primary) hypertension: Secondary | ICD-10-CM | POA: Diagnosis not present

## 2020-05-25 DIAGNOSIS — R82998 Other abnormal findings in urine: Secondary | ICD-10-CM | POA: Diagnosis not present

## 2021-06-10 DIAGNOSIS — R7301 Impaired fasting glucose: Secondary | ICD-10-CM | POA: Diagnosis not present

## 2021-06-10 DIAGNOSIS — E785 Hyperlipidemia, unspecified: Secondary | ICD-10-CM | POA: Diagnosis not present

## 2021-06-10 DIAGNOSIS — I10 Essential (primary) hypertension: Secondary | ICD-10-CM | POA: Diagnosis not present

## 2021-06-17 DIAGNOSIS — R7301 Impaired fasting glucose: Secondary | ICD-10-CM | POA: Diagnosis not present

## 2021-06-17 DIAGNOSIS — R82998 Other abnormal findings in urine: Secondary | ICD-10-CM | POA: Diagnosis not present

## 2021-06-17 DIAGNOSIS — I10 Essential (primary) hypertension: Secondary | ICD-10-CM | POA: Diagnosis not present

## 2021-06-17 DIAGNOSIS — K219 Gastro-esophageal reflux disease without esophagitis: Secondary | ICD-10-CM | POA: Diagnosis not present

## 2021-06-17 DIAGNOSIS — Z0000 Encounter for general adult medical examination without abnormal findings: Secondary | ICD-10-CM | POA: Diagnosis not present

## 2021-11-09 DIAGNOSIS — F17200 Nicotine dependence, unspecified, uncomplicated: Secondary | ICD-10-CM | POA: Diagnosis not present

## 2021-11-09 DIAGNOSIS — J209 Acute bronchitis, unspecified: Secondary | ICD-10-CM | POA: Diagnosis not present

## 2021-11-09 DIAGNOSIS — R051 Acute cough: Secondary | ICD-10-CM | POA: Diagnosis not present

## 2022-07-11 DIAGNOSIS — E785 Hyperlipidemia, unspecified: Secondary | ICD-10-CM | POA: Diagnosis not present

## 2022-07-11 DIAGNOSIS — R7301 Impaired fasting glucose: Secondary | ICD-10-CM | POA: Diagnosis not present

## 2022-07-11 DIAGNOSIS — I10 Essential (primary) hypertension: Secondary | ICD-10-CM | POA: Diagnosis not present

## 2022-07-11 DIAGNOSIS — E786 Lipoprotein deficiency: Secondary | ICD-10-CM | POA: Diagnosis not present

## 2022-08-01 DIAGNOSIS — I10 Essential (primary) hypertension: Secondary | ICD-10-CM | POA: Diagnosis not present

## 2022-08-01 DIAGNOSIS — Z1331 Encounter for screening for depression: Secondary | ICD-10-CM | POA: Diagnosis not present

## 2022-08-01 DIAGNOSIS — E785 Hyperlipidemia, unspecified: Secondary | ICD-10-CM | POA: Diagnosis not present

## 2022-08-01 DIAGNOSIS — F17200 Nicotine dependence, unspecified, uncomplicated: Secondary | ICD-10-CM | POA: Diagnosis not present

## 2022-08-01 DIAGNOSIS — Z1339 Encounter for screening examination for other mental health and behavioral disorders: Secondary | ICD-10-CM | POA: Diagnosis not present

## 2022-08-01 DIAGNOSIS — G90529 Complex regional pain syndrome I of unspecified lower limb: Secondary | ICD-10-CM | POA: Diagnosis not present

## 2022-08-01 DIAGNOSIS — Z0000 Encounter for general adult medical examination without abnormal findings: Secondary | ICD-10-CM | POA: Diagnosis not present

## 2022-12-06 DIAGNOSIS — K080 Exfoliation of teeth due to systemic causes: Secondary | ICD-10-CM | POA: Diagnosis not present

## 2023-02-22 ENCOUNTER — Encounter: Payer: Self-pay | Admitting: Internal Medicine

## 2023-06-13 DIAGNOSIS — K080 Exfoliation of teeth due to systemic causes: Secondary | ICD-10-CM | POA: Diagnosis not present

## 2023-08-07 DIAGNOSIS — R7301 Impaired fasting glucose: Secondary | ICD-10-CM | POA: Diagnosis not present

## 2023-08-07 DIAGNOSIS — E785 Hyperlipidemia, unspecified: Secondary | ICD-10-CM | POA: Diagnosis not present

## 2023-08-14 DIAGNOSIS — I10 Essential (primary) hypertension: Secondary | ICD-10-CM | POA: Diagnosis not present

## 2023-08-14 DIAGNOSIS — Z0000 Encounter for general adult medical examination without abnormal findings: Secondary | ICD-10-CM | POA: Diagnosis not present

## 2023-09-18 DIAGNOSIS — K080 Exfoliation of teeth due to systemic causes: Secondary | ICD-10-CM | POA: Diagnosis not present

## 2023-12-06 DIAGNOSIS — K080 Exfoliation of teeth due to systemic causes: Secondary | ICD-10-CM | POA: Diagnosis not present

## 2024-01-08 DIAGNOSIS — K080 Exfoliation of teeth due to systemic causes: Secondary | ICD-10-CM | POA: Diagnosis not present

## 2024-01-09 DIAGNOSIS — K080 Exfoliation of teeth due to systemic causes: Secondary | ICD-10-CM | POA: Diagnosis not present

## 2024-02-29 NOTE — Progress Notes (Signed)
 Courtney David                                          MRN: [id number]   02/29/2024   The [facility] Quality Team Specialist reviewed this patient medical record for the purposes of chart review for care gap closure. The following were reviewed: abstraction for care gap closure-controlling blood pressure.    [facility] Quality Team
